# Patient Record
Sex: Female | Born: 1985 | Race: White | Hispanic: No | Marital: Married | Smoking: Never smoker
Health system: Southern US, Community
[De-identification: ages and names within clinical notes are randomized; demographics above are authoritative.]

## PROBLEM LIST (undated history)

## (undated) DIAGNOSIS — Z789 Other specified health status: Secondary | ICD-10-CM

## (undated) HISTORY — PX: WISDOM TOOTH EXTRACTION: SHX21

---

## 2003-10-05 ENCOUNTER — Emergency Department (HOSPITAL_COMMUNITY): Admission: EM | Admit: 2003-10-05 | Discharge: 2003-10-06 | Payer: Self-pay | Admitting: Internal Medicine

## 2005-08-21 ENCOUNTER — Emergency Department (HOSPITAL_COMMUNITY): Admission: EM | Admit: 2005-08-21 | Discharge: 2005-08-21 | Payer: Self-pay | Admitting: Emergency Medicine

## 2007-01-01 IMAGING — CT CT ABDOMEN W/O CM
2 of 4 series · 13 of 32 positions shown, 18 images · IV contrast (agent unspecified)
Comparison: none

CLINICAL DATA: 19 year old with severe back pain on left side.  No prior stones.
 ABDOMEN CT WITHOUT CONTRAST:
TECHNIQUE: Multidetector CT imaging of the abdomen was performed following the standard stone protocol without IV contrast. 
 No prior exams are available for comparison.
TECHNIQUE: Multidetector CT imaging of the pelvis was performed following the standard protocol without IV contrast.

[Series 2: renal stone · axial · 0.66mm/px · z∈[-429,-164]mm · 5 of 81 slices shown, 10 images]
[im 14/81  soft-tissue]
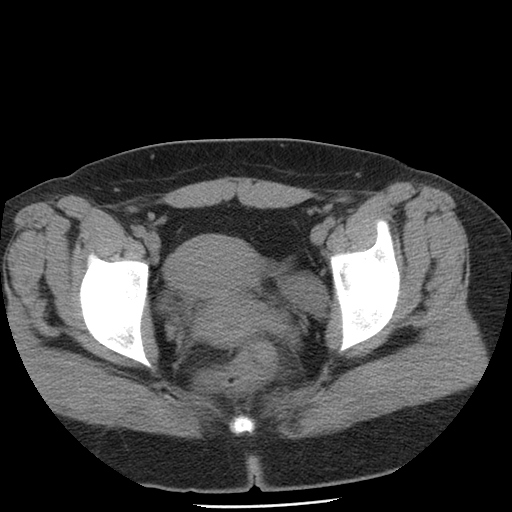
[im 14/81  bone]
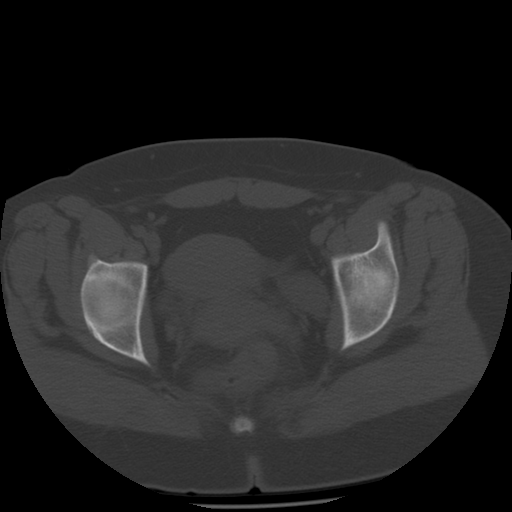
[im 27/81  soft-tissue]
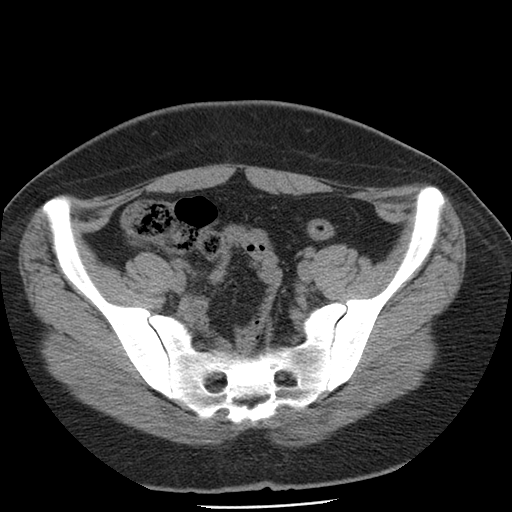
[im 27/81  lung]
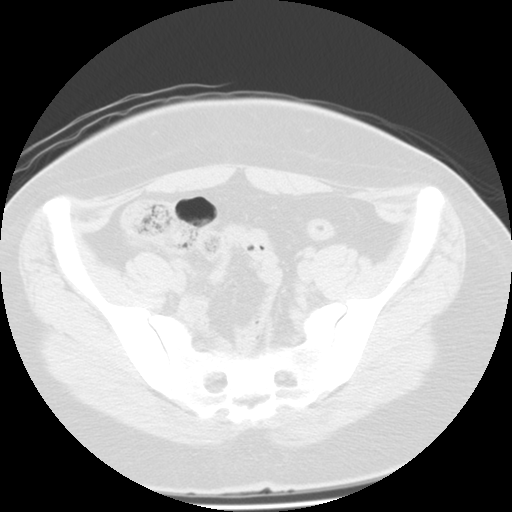
[im 41/81  soft-tissue]
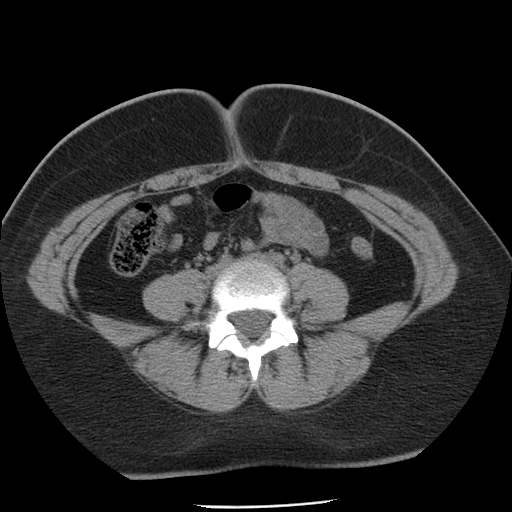
[im 41/81  lung]
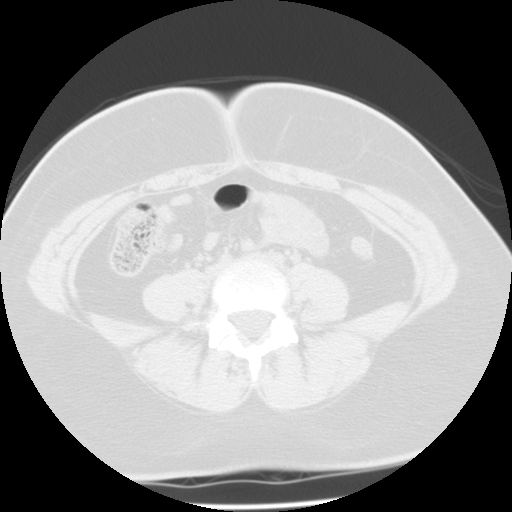
[im 54/81  soft-tissue]
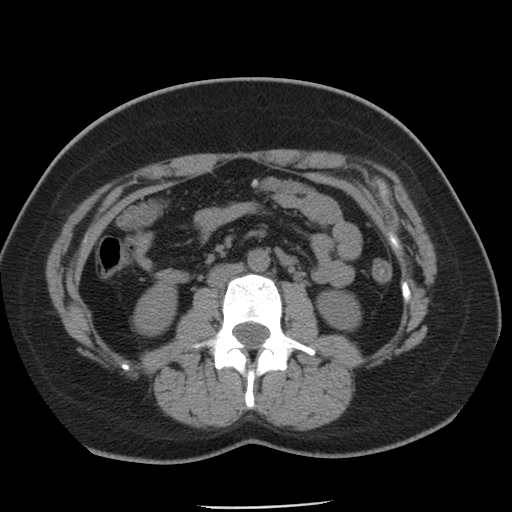
[im 54/81  lung]
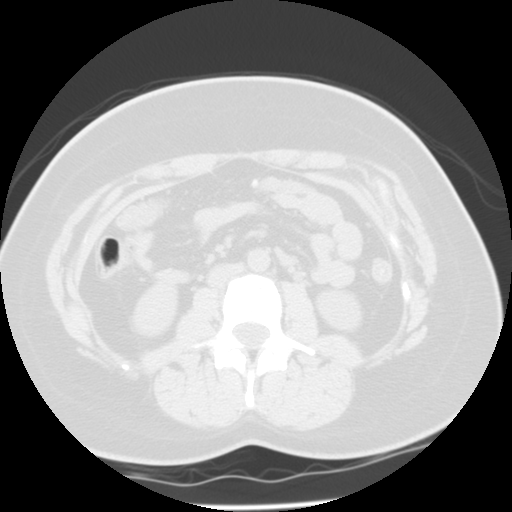
[im 67/81  soft-tissue]
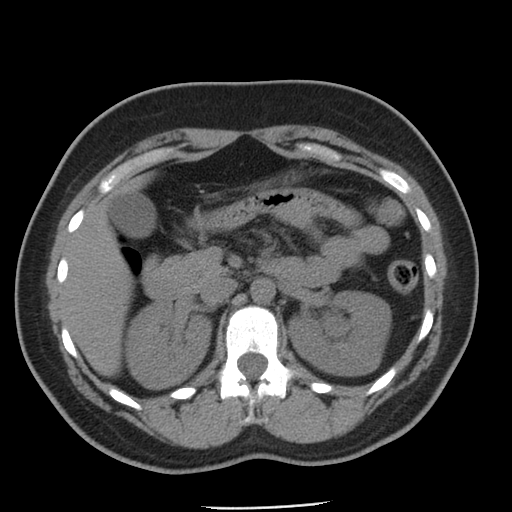
[im 67/81  lung]
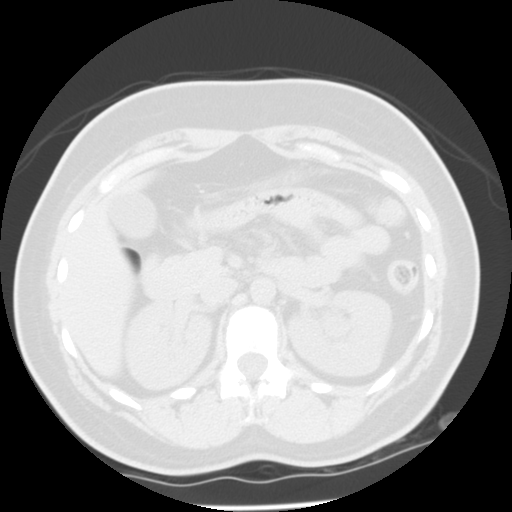

[Series 103: reformatted · sagittal · 0.84mm/px · 8 of 169 slices shown]
[im 13/169  soft-tissue]
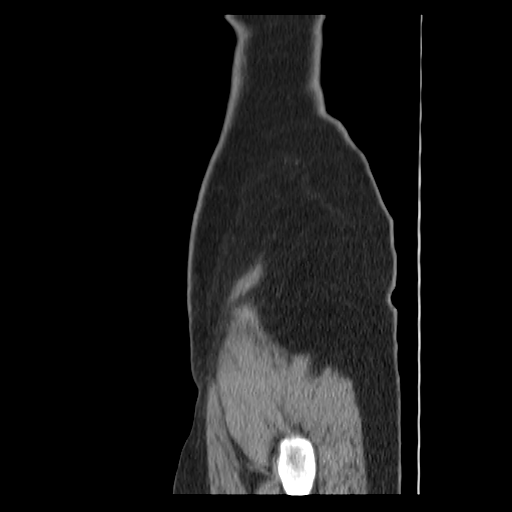
[im 39/169  soft-tissue]
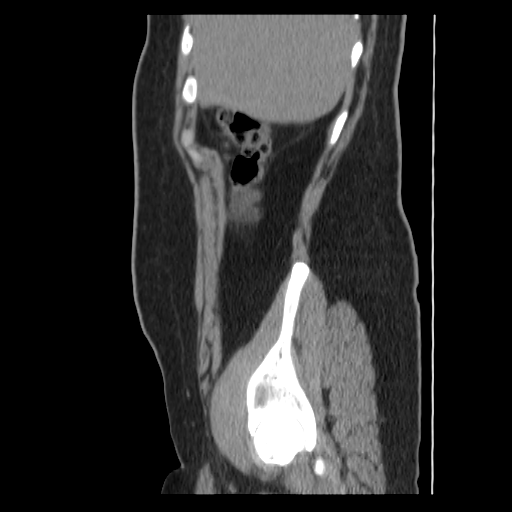
[im 52/169  soft-tissue]
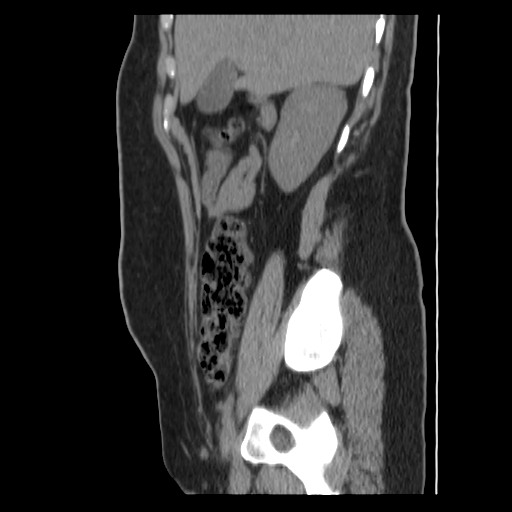
[im 78/169  soft-tissue]
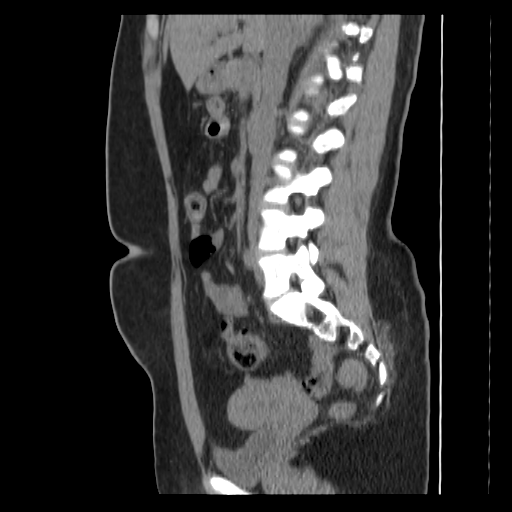
[im 91/169  soft-tissue]
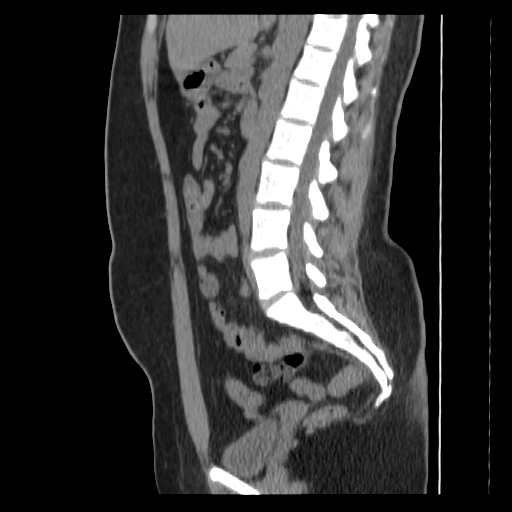
[im 117/169  soft-tissue]
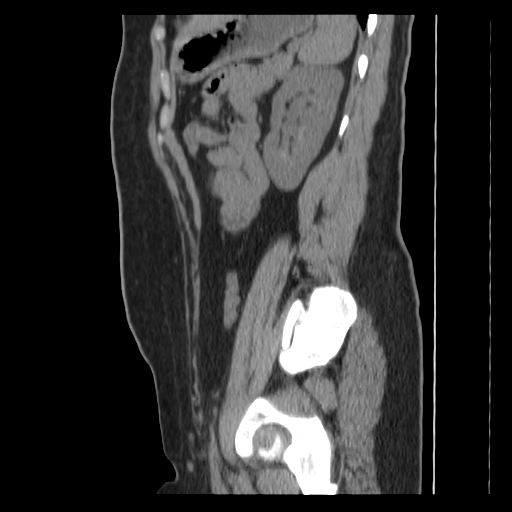
[im 130/169  soft-tissue]
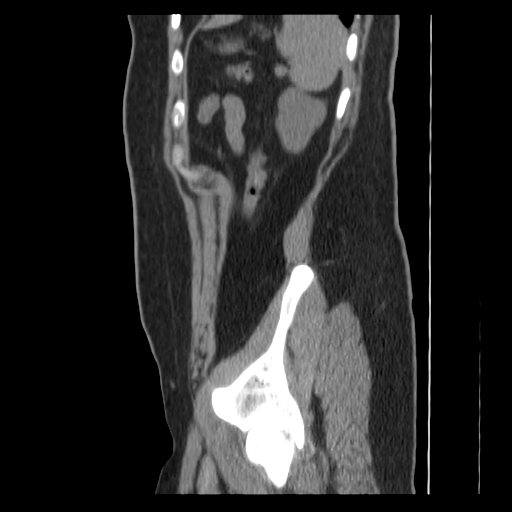
[im 156/169  soft-tissue]
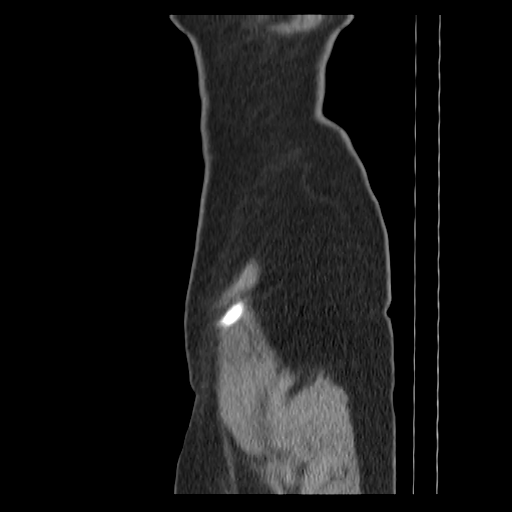

[13 of 32 positions shown; findings below may reference images not displayed]

FINDINGS: Visualized portions of the liver, spleen, pancreas, and right kidney have a normal appearance.  
 There is left hydronephrosis.  An intrarenal calculus is seen in the region of the dilated renal pelvis on the left, measuring 2 mm in diameter.  The left ureter is dilated and somewhat strandy to the level of the bladder.  At the level of the bladder, a 3 mm calculus is seen in the region of the ureterovesical junction.
IMPRESSION: 1.  Left hydronephrosis secondary to an obstructing left ureterovesical junction stone.
 2.  2 mm intrarenal calculus within the dilated left renal pelvis.
 PELVIS CT WITHOUT CONTRAST:
FINDINGS: The uterus is present.  No adnexal mass or free pelvic fluid is identified.
IMPRESSION: Left distal ureteral stone as described above.

## 2012-02-11 ENCOUNTER — Inpatient Hospital Stay (HOSPITAL_COMMUNITY)
Admission: AD | Admit: 2012-02-11 | Discharge: 2012-02-11 | Disposition: A | Discharge status: Home / Self Care | Payer: BC Managed Care – PPO | Source: Ambulatory Visit | Attending: Obstetrics & Gynecology | Admitting: Obstetrics & Gynecology

## 2012-02-11 ENCOUNTER — Emergency Department (HOSPITAL_COMMUNITY)
Admission: EM | Admit: 2012-02-11 | Discharge: 2012-02-11 | Disposition: A | Discharge status: Home / Self Care | Payer: Self-pay | Source: Home / Self Care | Attending: Emergency Medicine | Admitting: Emergency Medicine

## 2012-02-11 ENCOUNTER — Inpatient Hospital Stay (HOSPITAL_COMMUNITY): Payer: BC Managed Care – PPO

## 2012-02-11 ENCOUNTER — Encounter (HOSPITAL_COMMUNITY): Payer: Self-pay

## 2012-02-11 ENCOUNTER — Encounter (HOSPITAL_COMMUNITY): Payer: Self-pay | Admitting: Emergency Medicine

## 2012-02-11 DIAGNOSIS — O039 Complete or unspecified spontaneous abortion without complication: Secondary | ICD-10-CM

## 2012-02-11 HISTORY — DX: Other specified health status: Z78.9

## 2012-02-11 LAB — CBC
HCT: 33.1 % — ABNORMAL LOW (ref 36.0–46.0)
Hemoglobin: 11.2 g/dL — ABNORMAL LOW (ref 12.0–15.0)
MCHC: 33.8 g/dL (ref 30.0–36.0)
MCV: 91.7 fL (ref 78.0–100.0)
WBC: 12.1 10*3/uL — ABNORMAL HIGH (ref 4.0–10.5)

## 2012-02-11 LAB — POCT URINALYSIS DIP (DEVICE)
Ketones, ur: NEGATIVE mg/dL
Protein, ur: 30 mg/dL — AB
Specific Gravity, Urine: >=1.03 (ref 1.005–1.030)

## 2012-02-11 LAB — HCG, QUANTITATIVE, PREGNANCY: hCG, Beta Chain, Quant, S: 539 m[IU]/mL — ABNORMAL HIGH (ref <5)

## 2012-02-11 LAB — POCT PREGNANCY, URINE: Preg Test, Ur: POSITIVE — AB

## 2012-02-11 MED ORDER — HYDROCODONE-ACETAMINOPHEN 5-325 MG PO TABS
ORAL_TABLET | ORAL | Status: AC
  Filled 2012-02-11: qty 2

## 2012-02-11 MED ORDER — HYDROCODONE-ACETAMINOPHEN 5-325 MG PO TABS
2.0000 | ORAL_TABLET | Freq: Once | ORAL | Status: AC
  Administered 2012-02-11: 2 via ORAL

## 2012-02-11 MED ORDER — OXYCODONE-ACETAMINOPHEN 5-325 MG PO TABS: 2.0000 | ORAL_TABLET | ORAL | Status: AC | PRN

## 2012-02-11 MED ORDER — IBUPROFEN 600 MG PO TABS: 600.0000 mg | ORAL_TABLET | Freq: Four times a day (QID) | ORAL | Status: DC | PRN

## 2012-02-11 NOTE — MAU Provider Note (Signed)
Joanna Page [id number] y.o.G1P0 @[redacted]w[redacted]d  by LMP Chief Complaint  Patient presents with  . Miscarriage     None     SUBJECTIVE  HPI: Pt presents to MAU with vaginal bleeding, LLQ pain, and PPT today at urgent care.  She reports period-like cramping with more pain on left side but some tenderness on both sides.  Her pain was intense earlier today but has decreased and is mild now.  Patient's last menstrual period was 12/27/2011.  She has irregular periods.  She and her partner are not actively trying but are not using contraception. Her vaginal bleeding started 1 week ago but increased yesterday and she passed a large gray/black clot which she photographed.  She is not soaking 1 pad/hour or greater but bleeding is bright red and heavy for her. She denies dizziness, n/v, h/a, or fever/chills.    Past Medical History  Diagnosis Date  . No pertinent past medical history    Past Surgical History  Procedure Date  . Wisdom tooth extraction    History   Social History  . Marital Status: Married    Spouse Name: N/A    Number of Children: N/A  . Years of Education: N/A   Occupational History  . Not on file.   Social History Main Topics  . Smoking status: Never Smoker   . Smokeless tobacco: Not on file  . Alcohol Use: Yes     occasional  . Drug Use: No  . Sexually Active: Yes    Birth Control/ Protection: None   Other Topics Concern  . Not on file   Social History Narrative  . No narrative on file   Current Facility-Administered Medications on File Prior to Encounter  Medication Dose Route Frequency Provider Last Rate Last Dose  . HYDROcodone-acetaminophen (NORCO) 5-325 MG per tablet 2 tablet  2 tablet Oral Once Luiz Blare, MD   2 tablet at 02/11/12 1706   Current Outpatient Prescriptions on File Prior to Encounter  Medication Sig Dispense Refill  . ibuprofen (ADVIL,MOTRIN) 800 MG tablet Take 800 mg by mouth every 8 (eight) hours as needed. pain       No Known  Allergies  ROS: Pertinent items in HPI  OBJECTIVE Blood pressure 128/92, pulse 91, temperature 98.2 F (36.8 C), temperature source Oral, resp. rate 16, height 5\' 10"  (1.778 m), weight 104.101 kg (229 lb 8 oz), last menstrual period 12/27/2011, SpO2 100.00%.  GENERAL: Well-developed, well-nourished female in no acute distress.  HEENT: Normocephalic, good dentition HEART: normal rate RESP: normal effort ABDOMEN: Soft, nontender EXTREMITIES: Nontender, no edema NEURO: Alert and oriented Pelvic exam: Cervix pink, visually closed, without lesion, moderate amount bright red bleeding, vaginal walls and external genitalia normal Bimanual exam: Cervix 0/long/high, firm, anterior, neg CMT, uterus mildly tender, nonenlarged, adnexa with mild tenderness on left, none on right, no enlargement or mass bilaterally No guarding with palpation of LLQ  LAB RESULTS Results for orders placed during the hospital encounter of 02/11/12 (from the past 24 hour(s))  CBC     Status: Abnormal   Collection Time   02/11/12  7:55 PM      Component Value Range   WBC 12.1 (*) 4.0 - 10.5 K/uL   RBC 3.61 (*) 3.87 - 5.11 MIL/uL   Hemoglobin 11.2 (*) 12.0 - 15.0 g/dL   HCT 32.4 (*) 40.1 - 02.7 %   MCV 91.7  78.0 - 100.0 fL   MCH 31.0  26.0 - 34.0 pg   MCHC 33.8  30.0 - 36.0 g/dL   RDW 16.1  09.6 - 04.5 %   Platelets 271  150 - 400 K/uL  ABO/RH     Status: Normal   Collection Time   02/11/12  7:55 PM      Component Value Range   ABO/RH(D) A POS    HCG, QUANTITATIVE, PREGNANCY     Status: Abnormal   Collection Time   02/11/12  7:55 PM      Component Value Range   hCG, Beta Chain, Quant, S 539 (*) <5 mIU/mL     IMAGING US Ob Comp Less 14 Wks  02/11/2012  *RADIOLOGY REPORT*  Clinical Data: 6-week 4 days pregnant by last menstrual period. Bleeding and passing clots.  Beta HCG pending.  OBSTETRIC <14 WK [place] AND TRANSVAGINAL OB US  Technique:  Both transabdominal and transvaginal ultrasound examinations were  performed for complete evaluation of the gestation as well as the maternal uterus, adnexal regions, and pelvic cul-de-sac.  Transvaginal technique was performed to assess early pregnancy.  Comparison:  CT of 08/21/2005.  Intrauterine gestational sac:  Absent Yolk sac: Absent Embryo: Absent; endometrial thickening at up to 2.0 cm.  Maternal uterus/adnexae: The right ovary / adnexa is within normal limits.  Along the inferior aspect of the left ovary is a 3.5 x 2.4 x 3.1 cm heterogeneous "mass."  There is suggestion of minimal vascularity on image 46.  There is a moderate amount of complex pelvic fluid.  IMPRESSION:  1.  Lack of visualization of the gestational sac or intrauterine pregnancy.  Differential considerations include early intrauterine pregnancy, missed abortion, and ectopic pregnancy.  Correlation with ongoing beta HCG and possibly pelvic ultrasound follow-up should be considered. 2.  Nonspecific left adnexal "mass."  Concurrent complex moderate volume pelvic fluid.  Differential considerations include rupture of a hemorrhagic corpus luteal cyst or otherwise occult ectopic pregnancy.  Recommend clinical correlation, as the clinical history does not describe pain or other symptoms to suggest a ruptured ectopic pregnancy.  Original Report Authenticated By: Consuello Bossier, M.D.   US Ob Transvaginal  02/11/2012  *RADIOLOGY REPORT*  Clinical Data: 6-week 4 days pregnant by last menstrual period. Bleeding and passing clots.  Beta HCG pending.  OBSTETRIC <14 WK [place] AND TRANSVAGINAL OB US  Technique:  Both transabdominal and transvaginal ultrasound examinations were performed for complete evaluation of the gestation as well as the maternal uterus, adnexal regions, and pelvic cul-de-sac.  Transvaginal technique was performed to assess early pregnancy.  Comparison:  CT of 08/21/2005.  Intrauterine gestational sac:  Absent Yolk sac: Absent Embryo: Absent; endometrial thickening at up to 2.0 cm.  Maternal  uterus/adnexae: The right ovary / adnexa is within normal limits.  Along the inferior aspect of the left ovary is a 3.5 x 2.4 x 3.1 cm heterogeneous "mass."  There is suggestion of minimal vascularity on image 46.  There is a moderate amount of complex pelvic fluid.  IMPRESSION:  1.  Lack of visualization of the gestational sac or intrauterine pregnancy.  Differential considerations include early intrauterine pregnancy, missed abortion, and ectopic pregnancy.  Correlation with ongoing beta HCG and possibly pelvic ultrasound follow-up should be considered. 2.  Nonspecific left adnexal "mass."  Concurrent complex moderate volume pelvic fluid.  Differential considerations include rupture of a hemorrhagic corpus luteal cyst or otherwise occult ectopic pregnancy.  Recommend clinical correlation, as the clinical history does not describe pain or other symptoms to suggest a ruptured ectopic pregnancy.  Original Report Authenticated By: Karn Cassis.  TALBOT, M.D.    ASSESSMENT SAB  PLAN Called Dr Debroah Loop to discuss assessment, findings, and U/S report D/C home with ectopic teaching/precautions, and bleeding precautions Discussed likelihood of SAB with pt and s/o, opportunity for questions provided Return to MAU in 48 hours for f/u quantitative HCG   LEFTWICH-KIRBY, Medha Pippen 02/11/2012 10:00 PM

## 2012-02-11 NOTE — Discharge Instructions (Signed)
Go to [facility] now. You need ultrasound, blood work done.

## 2012-02-11 NOTE — ED Provider Notes (Signed)
History     CSN: [id number]  Arrival date & time 02/11/12  1355   First MD Initiated Contact with Patient 02/11/12 1451      Chief Complaint  Patient presents with  . Dysmenorrhea    (Consider location/radiation/quality/duration/timing/severity/associated sxs/prior treatment) HPI Comments: Patient is a G1 P0 who reports severe, intermittent, crampy lower midline abdominal pain starting yesterday. States she passed some tissue, and is having heavy bleeding. States she has going through 4 tampons today. She took ibuprofen 800 mg around 1 PM today without relief. No aggravating or alleviating factors. She is unsure when her last normal menstrual period was- "some time around the end of April or first of March". States she has irregular menses, but that her period was about a week late this time. This is not unusual for her. No chest pain no shortness of breath, presyncope, syncope. Patient has no history of PID, abdominal surgeries.    ROS as noted in HPI. All other ROS negative.   Patient is a 26 y.o. female presenting with vaginal bleeding. The history is provided by the patient.  Vaginal Bleeding This is a new problem. The current episode started yesterday. The problem occurs constantly. The problem has been gradually worsening. Associated symptoms include abdominal pain. Nothing aggravates the symptoms. Nothing relieves the symptoms. Treatments tried: ibu 800. The treatment provided no relief.    History reviewed. No pertinent past medical history.  History reviewed. No pertinent past surgical history.  History reviewed. No pertinent family history.  History  Substance Use Topics  . Smoking status: Never Smoker   . Smokeless tobacco: Not on file  . Alcohol Use: Yes    OB History    Grav Para Term Preterm Abortions TAB SAB Ect Mult Living                  Review of Systems  Gastrointestinal: Positive for abdominal pain.  Genitourinary: Positive for vaginal bleeding.     Allergies  Review of patient's allergies indicates no known allergies.  Home Medications   Current Outpatient Rx  Name Route Sig Dispense Refill  . IBUPROFEN 800 MG PO TABS Oral Take 800 mg by mouth every 8 (eight) hours as needed.      BP 133/81  Pulse 98  Temp 98.8 F (37.1 C) (Oral)  Resp 18  SpO2 99%  LMP 02/11/2012  Physical Exam  Nursing note and vitals reviewed. Constitutional: She is oriented to person, place, and time. She appears well-developed and well-nourished. She appears distressed.  HENT:  Head: Normocephalic and atraumatic.  Eyes: Conjunctivae and EOM are normal.  Neck: Normal range of motion.  Cardiovascular: Normal rate.   Pulmonary/Chest: Effort normal.  Abdominal: She exhibits no distension.  Musculoskeletal: Normal range of motion.  Neurological: She is alert and oriented to person, place, and time.  Skin: Skin is warm and dry.  Psychiatric: She has a normal mood and affect. Her behavior is normal. Judgment and thought content normal.    ED Course  Procedures (including critical care time)  Labs Reviewed  POCT URINALYSIS DIP (DEVICE) - Abnormal; Notable for the following:    Hgb urine dipstick LARGE (*)     Protein, ur 30 (*)     All other components within normal limits  POCT PREGNANCY, URINE - Abnormal; Notable for the following:    Preg Test, Ur POSITIVE (*)     All other components within normal limits   No results found.   1. Miscarriage  Results for orders placed during the hospital encounter of 02/11/12  POCT URINALYSIS DIP (DEVICE)      Component Value Range   Glucose, UA NEGATIVE  NEGATIVE mg/dL   Bilirubin Urine NEGATIVE  NEGATIVE   Ketones, ur NEGATIVE  NEGATIVE mg/dL   Specific Gravity, Urine >=1.030  1.005 - 1.030   Hgb urine dipstick LARGE (*) NEGATIVE   pH 6.0  5.0 - 8.0   Protein, ur 30 (*) NEGATIVE mg/dL   Urobilinogen, UA 0.2  0.0 - 1.0 mg/dL   Nitrite NEGATIVE  NEGATIVE   Leukocytes, UA NEGATIVE   NEGATIVE  POCT PREGNANCY, URINE      Component Value Range   Preg Test, Ur POSITIVE (*) NEGATIVE    MDM  Patient approximately 6-[redacted] weeks pregnant in process of miscarrying. Patient has a photo of the tissue that she passed, which is consistent with products of conception. Urine pregnancy positive.  Gave Norco 2 tabs by mouth here. Discussed with MAU.  Deferring pelvic here, Transferring to women's for bloodwork, u/s to r/o retained POC +/- rhogam.  Luiz Blare, MD 02/11/12 1701

## 2012-02-11 NOTE — Discharge Instructions (Signed)
Miscarriage (Spontaneous Miscarriage) A miscarriage is when you lose your baby before the twentieth week of pregnancy. Miscarriages happen in 15-20% of pregnancies. Most miscarriages happen in the first 13 weeks of the pregnancy. In medical terms, this is called a spontaneous miscarriage or early pregnancy loss. No further treatment is needed when the miscarriage is complete and all products of conception have been passed out of the body. You can begin trying for another pregnancy as soon as your caregiver says it is okay. CAUSES   Most causes are not known.   Genetic problems like abnormal, not enough or too many chromosomes.   Infection of the cervix or uterus.   An abnormal shaped uterus, fibroid tumors or congenital abnormalities.   Hormone problems.   Medical problems.   Incompetent cervix, the tissue in the cervix is not strong enough to hold the pregnancy.   Smoking, too much alcohol use and illegal drugs.   Trauma.  SYMPTOMS   Bleeding or spotting from the vagina.   Cramping of the lower abdomen.   Passing of fluid from the vagina with or without cramps or pain.   Passing fetal tissue.  TREATMENT   Sometimes no further treatment is necessary if you pass all the tissue in the uterus.   If partial parts of the fetus or placenta remain in the body (incomplete miscarriage), tissue left behind may become infected. Usually a D and C (Dilatation and Curettage) suction or scrapping of the uterus is necessary to remove the remaining tissue in uterus. The procedure is only done when your caregiver knows that there is no chance for the pregnancy to continue. This is determined by a physical exam, a negative pregnancy test, blood tests and perhaps an ultrasound revealing a dead fetus or no fetus developing because a problem occurred at conception (when the sperm and egg unite).   Medications may be necessary, antibiotics if there is an infection or medications to contract the uterus  if there is a lot of bleeding.   If you have Rh negative blood and your partner is Rh positive, you will need a Rho-gam shot (an immune globulin vaccine). This will protect your baby from having Rh blood problems in future pregnancies.  HOME CARE INSTRUCTIONS   Your caregiver may order bed rest (up to the bathroom only). He or she may allow you to continue light activity. You may need to make arrangements for the care of children and for any other responsibilities.   Keep track of the number of pads you use each day and how soaked (saturated) they are. Record this information.   Do not use tampons. Do not douche or have sexual intercourse until approved by your caregiver.   Only take over-the-counter or prescription medicines for pain, discomfort or fever as directed by your caregiver.   Do not take aspirin because it can cause bleeding.   It is very important to keep all follow-up appointments for re-evaluations and continuing management.   Tell your caregiver if you are experiencing domestic violence.   Women who have an Rh negative blood type (i.e., A, B, AB, or O negative) need to receive a drug called Rh(D) immune globulin (RhoGam). This medicine helps protect future fetuses against problems that can occur if an Rh negative mother is carrying a baby who is Rh positive.   If you and/or your partner are having problems with guilt or grieving, talk to your caregiver or seek counseling to help you cope with the pregnancy loss.   Allow enough time to grieve before trying to get pregnant again.  SEEK IMMEDIATE MEDICAL CARE IF:   You have severe cramps or pain in your stomach, back, or belly (abdomen).   You have a fever.   You pass large clots or tissue. Save any tissue for your caregiver to inspect.   Your bleeding increases.   You become light-headed, weak or have fainting episodes.   You develop chills.  Document Released: 02/10/2001 Document Revised: 08/06/2011 Document Reviewed:  03/19/2008 [employer] Patient Information 2012 [employer] .Ectopic Pregnancy An ectopic pregnancy is when the fertilized egg attaches (implants) outside the uterus. Most ectopic pregnancies occur in the fallopian tube. Rarely do ectopic pregnancies occur on the ovary, intestine, pelvis, or cervix. An ectopic pregnancy does not have the ability to develop into a normal, healthy baby.  A ruptured ectopic pregnancy is one in which the fallopian tube gets torn or bursts and results in internal bleeding. Often there is intense abdominal pain, and sometimes, vaginal bleeding. Having an ectopic pregnancy can be a life-threatening experience. If left untreated, this dangerous condition can lead to a blood transfusion, abdominal surgery, or even death. CAUSES  Damage to the fallopian tubes is the suspected cause in most ectopic pregnancies.  RISK FACTORS Depending on your circumstances, the amount of risk of having an ectopic pregnancy will vary. There are 3 categories that may help you identify whether you are potentially at risk. High Risk  You have gone through infertility treatment.   You have had a previous ectopic pregnancy.   You have had previous tubal surgery.   You have had previous surgery to have the fallopian tubes tied (tubal ligation).   You have tubal problems or diseases.   You have been exposed to DES. DES is a medicine that was used until 1971 and had effects on babies whose mothers took the medicine.   You become pregnant while using an intrauterine device (IUD) for birth control.  Moderate Risk  You have a history of infertility.   You have a history of a sexually transmitted infection (STI).   You have a history of pelvic inflammatory disease (PID).   You have scarring from endometriosis.   You have multiple sexual partners.   You smoke.  Low Risk  You have had previous pelvic surgery.   You use vaginal douching.   You became sexually active before 28  years of age.  SYMPTOMS  An ectopic pregnancy should be suspected in anyone who has missed a period and has abdominal pain or bleeding.  You may experience normal pregnancy symptoms, such as:   Nausea.   Tiredness.   Breast tenderness.   Symptoms that are not normal include:   Pain with intercourse.   Irregular vaginal bleeding or spotting.   Cramping or pain on one side, or in the lower abdomen.   Fast heartbeat.   Passing out while having a bowel movement.   Symptoms of a ruptured ectopic pregnancy and internal bleeding may include:   Sudden, severe pain in the abdomen and pelvis.   Dizziness or fainting.   Pain in the shoulder area.  DIAGNOSIS  Tests that may be performed include:  A pregnancy test.   An ultrasound.   Testing the specific level of pregnancy hormone in the bloodstream.   Taking a sample of uterus tissue (dilation and curettage, D&C).   Surgery to perform a visual exam of the inside of the abdomen using a lighted tube (laparoscopy).  TREATMENT    An injection of methotrexate medicine may be given. This is given if:  The diagnosis is made early.   The fallopian tube has not ruptured.   You are considered to be a good candidate for the medicine.  Usually, pregnancy hormone blood levels are checked after methotrexate treatment. This is to be sure the medicine is effective. It may take 4 to 6 weeks for the pregnancy to be absorbed (though most pregnancies will be absorbed by 3 weeks). Surgical treatment may be needed. A lighted tube (laparoscope) is used to remove the tubal pregnancy. If severe internal bleeding occurs, a cut (incision) may be made in the lower abdomen (laparotomy), and the ectopic pregnancy is removed. This stops the bleeding. Part of the fallopian tube, or the whole tube, may be removed as well (salpingectomy). After surgery, pregnancy hormone tests may be done to be sure there is no pregnancy tissue left. You may receive a RhoGAM  shot if you are Rh negative and the father is Rh positive, or if you do not know the Rh type of the father. This is to prevent problems with any future pregnancy. SEEK IMMEDIATE MEDICAL CARE IF:  You have any symptoms of an ectopic pregnancy. This is a medical emergency. Document Released: 09/24/2004 Document Revised: 08/06/2011 Document Reviewed: 04/23/2011 [employer] Patient Information 2012 [employer]. 

## 2012-02-11 NOTE — MAU Note (Signed)
Pt states passed clots and tissue yesterday, lmp-12/27/2011, having lower abd cramps that began back today, moderate bleeding at present, changing tampon every few hours. Used 5 tampons thus far. Earlier noted blood clots, none at present.

## 2012-02-11 NOTE — ED Notes (Signed)
Patient reports abdominal pain, unusual for menses.  Also has described an unusually heavy period, occuring late in cycle.  Not on birth control.  Dr Chaney Malling with patient

## 2012-02-13 ENCOUNTER — Inpatient Hospital Stay (HOSPITAL_COMMUNITY)
Admission: AD | Admit: 2012-02-13 | Discharge: 2012-02-13 | Disposition: A | Discharge status: Home / Self Care | Payer: BC Managed Care – PPO | Source: Ambulatory Visit | Attending: Obstetrics & Gynecology | Admitting: Obstetrics & Gynecology

## 2012-02-13 ENCOUNTER — Other Ambulatory Visit: Payer: Self-pay | Admitting: Family

## 2012-02-13 ENCOUNTER — Encounter (HOSPITAL_COMMUNITY): Payer: Self-pay | Admitting: *Deleted

## 2012-02-13 ENCOUNTER — Encounter (HOSPITAL_COMMUNITY): Payer: Self-pay | Admitting: Emergency Medicine

## 2012-02-13 DIAGNOSIS — O009 Unspecified ectopic pregnancy without intrauterine pregnancy: Secondary | ICD-10-CM

## 2012-02-13 DIAGNOSIS — O00109 Unspecified tubal pregnancy without intrauterine pregnancy: Secondary | ICD-10-CM | POA: Insufficient documentation

## 2012-02-13 LAB — CBC
Hemoglobin: 10.9 g/dL — ABNORMAL LOW (ref 12.0–15.0)
RBC: 3.5 MIL/uL — ABNORMAL LOW (ref 3.87–5.11)

## 2012-02-13 LAB — DIFFERENTIAL
Lymphs Abs: 2.8 10*3/uL (ref 0.7–4.0)
Monocytes Relative: 9 % (ref 3–12)
Neutro Abs: 7.5 10*3/uL (ref 1.7–7.7)
Neutrophils Relative %: 66 % (ref 43–77)

## 2012-02-13 LAB — CREATININE, SERUM
Creatinine, Ser: 0.56 mg/dL (ref 0.50–1.10)
GFR calc Af Amer: >90 mL/min (ref >90)
GFR calc non Af Amer: >90 mL/min (ref >90)

## 2012-02-13 LAB — HCG, QUANTITATIVE, PREGNANCY: hCG, Beta Chain, Quant, S: 552 m[IU]/mL — ABNORMAL HIGH (ref <5)

## 2012-02-13 LAB — BUN: BUN: 12 mg/dL (ref 6–23)

## 2012-02-13 MED ORDER — METHOTREXATE INJECTION FOR WOMEN'S HOSPITAL
50.0000 mg/m2 | Freq: Once | INTRAMUSCULAR | Status: AC
  Administered 2012-02-13: 115 mg via INTRAMUSCULAR
  Filled 2012-02-13: qty 2.3

## 2012-02-13 NOTE — MAU Provider Note (Signed)
History   Chief Complaint:  Follow-up   Joanna Page is  26 y.o. G1P0 Patient's last menstrual period was 12/27/2011.Marland Kitchen Patient is here for follow up of quantitative HCG and ongoing surveillance of pregnancy status.   She is [redacted]w[redacted]d weeks gestation  by LMP.    Since her last visit, the patient is without new complaint.   The patient reports bleeding as  none now.    General ROS:  negative  Her previous Quantitative HCG values are: 6/13: 539    Physical Exam   Blood pressure 131/72, pulse 84, temperature 99.2 F (37.3 C), temperature source Oral, resp. rate 16, height 5\' 10"  (1.778 m), weight 229 lb 6 oz (104.044 kg), last menstrual period 12/27/2011.  Focused Gynecological Exam: examination not indicated  Labs: Recent Results (from the past 24 hour(s))  HCG, QUANTITATIVE, PREGNANCY   Collection Time   02/13/12  8:25 PM      Component Value Range   hCG, Beta Chain, Quant, S 552 (*) <5 mIU/mL    Ultrasound Studies:   RADIOLOGY REPORT*  Clinical Data: 6-week 4 days pregnant by last menstrual period.  Bleeding and passing clots. Beta HCG pending.  OBSTETRIC <14 WK [place] AND TRANSVAGINAL OB US  Technique: Both transabdominal and transvaginal ultrasound  examinations were performed for complete evaluation of the  gestation as well as the maternal uterus, adnexal regions, and  pelvic cul-de-sac. Transvaginal technique was performed to assess  early pregnancy.  Comparison: CT of 08/21/2005.  Intrauterine gestational sac: Absent  Yolk sac: Absent  Embryo: Absent; endometrial thickening at up to 2.0 cm.  Maternal uterus/adnexae:  The right ovary / adnexa is within normal limits. Along the  inferior aspect of the left ovary is a 3.5 x 2.4 x 3.1 cm  heterogeneous "mass." There is suggestion of minimal vascularity  on image 46.  There is a moderate amount of complex pelvic fluid.  IMPRESSION:  1. Lack of visualization of the gestational sac or intrauterine  pregnancy. Differential  considerations include early intrauterine  pregnancy, missed abortion, and ectopic pregnancy. Correlation  with ongoing beta HCG and possibly pelvic ultrasound follow-up  should be considered.  2. Nonspecific left adnexal "mass." Concurrent complex moderate  volume pelvic fluid. Differential considerations include rupture  of a hemorrhagic corpus luteal cyst or otherwise occult ectopic  pregnancy. Recommend clinical correlation, as the clinical history  does not describe pain or other symptoms to suggest a ruptured  ectopic pregnancy.  Original Report Authenticated By: Consuello Bossier, M.D.   ED Course: Results discussed with patient. Reviewed highly suspicious of ectopic given abnormal rise in [place] and [place]. Recommend MTX tonight. Risk/benefits reviewed. Pt verbalizes understanding and agrees with plan.   MD Consult: Discussed with Dr. Macon Large, agrees with plan for MTX tonight  Assessment: Ectopic Pregnancy   Plan: MTX now, FU in 4 days per protocol Precautions reviewed  Joanna Page E. 02/13/2012, 9:42 PM

## 2012-02-13 NOTE — Discharge Instructions (Signed)
Ectopic Pregnancy An ectopic pregnancy is when the fertilized egg attaches (implants) outside the uterus. Most ectopic pregnancies occur in the fallopian tube. Rarely do ectopic pregnancies occur on the ovary, intestine, pelvis, or cervix. An ectopic pregnancy does not have the ability to develop into a normal, healthy baby.  A ruptured ectopic pregnancy is one in which the fallopian tube gets torn or bursts and results in internal bleeding. Often there is intense abdominal pain, and sometimes, vaginal bleeding. Having an ectopic pregnancy can be a life-threatening experience. If left untreated, this dangerous condition can lead to a blood transfusion, abdominal surgery, or even death. CAUSES  Damage to the fallopian tubes is the suspected cause in most ectopic pregnancies.  RISK FACTORS Depending on your circumstances, the amount of risk of having an ectopic pregnancy will vary. There are 3 categories that may help you identify whether you are potentially at risk. High Risk  You have gone through infertility treatment.   You have had a previous ectopic pregnancy.   You have had previous tubal surgery.   You have had previous surgery to have the fallopian tubes tied (tubal ligation).   You have tubal problems or diseases.   You have been exposed to DES. DES is a medicine that was used until 1971 and had effects on babies whose mothers took the medicine.   You become pregnant while using an intrauterine device (IUD) for birth control.  Moderate Risk  You have a history of infertility.   You have a history of a sexually transmitted infection (STI).   You have a history of pelvic inflammatory disease (PID).   You have scarring from endometriosis.   You have multiple sexual partners.   You smoke.  Low Risk  You have had previous pelvic surgery.   You use vaginal douching.   You became sexually active before 28 years of age.  SYMPTOMS  An ectopic pregnancy should be suspected  in anyone who has missed a period and has abdominal pain or bleeding.  You may experience normal pregnancy symptoms, such as:   Nausea.   Tiredness.   Breast tenderness.   Symptoms that are not normal include:   Pain with intercourse.   Irregular vaginal bleeding or spotting.   Cramping or pain on one side, or in the lower abdomen.   Fast heartbeat.   Passing out while having a bowel movement.   Symptoms of a ruptured ectopic pregnancy and internal bleeding may include:   Sudden, severe pain in the abdomen and pelvis.   Dizziness or fainting.   Pain in the shoulder area.  DIAGNOSIS  Tests that may be performed include:  A pregnancy test.   An ultrasound.   Testing the specific level of pregnancy hormone in the bloodstream.   Taking a sample of uterus tissue (dilation and curettage, D&C).   Surgery to perform a visual exam of the inside of the abdomen using a lighted tube (laparoscopy).  TREATMENT  An injection of methotrexate medicine may be given. This is given if:  The diagnosis is made early.   The fallopian tube has not ruptured.   You are considered to be a good candidate for the medicine.  Usually, pregnancy hormone blood levels are checked after methotrexate treatment. This is to be sure the medicine is effective. It may take 4 to 6 weeks for the pregnancy to be absorbed (though most pregnancies will be absorbed by 3 weeks). Surgical treatment may be needed. A lighted tube (laparoscope) is   used to remove the tubal pregnancy. If severe internal bleeding occurs, a cut (incision) may be made in the lower abdomen (laparotomy), and the ectopic pregnancy is removed. This stops the bleeding. Part of the fallopian tube, or the whole tube, may be removed as well (salpingectomy). After surgery, pregnancy hormone tests may be done to be sure there is no pregnancy tissue left. You may receive a RhoGAM shot if you are Rh negative and the father is Rh positive, or if you do  not know the Rh type of the father. This is to prevent problems with any future pregnancy. SEEK IMMEDIATE MEDICAL CARE IF:  You have any symptoms of an ectopic pregnancy. This is a medical emergency. Document Released: 09/24/2004 Document Revised: 08/06/2011 Document Reviewed: 04/23/2011 [facility] Patient Information 2012 [place], [place].Methotrexate Treatment for an Ectopic Pregnancy Care After Refer to this sheet in the next few weeks. These instructions provide you with information on caring for yourself after your treatment. Your caregiver may also give you more specific instructions. Your treatment has been planned according to current medical practices, but problems sometimes occur. Call your caregiver if you have any problems or questions after your treatment. HOME CARE INSTRUCTIONS  After you have received the methotrexate medicine, you need to be careful of your activities and watch your condition for several weeks. Even with methotrexate, an ectopic pregnancy can begin to bleed without warning. It may take 1 week before your pregnancy hormone level starts to drop.  Keep all follow-up blood work appointments as directed by your caregiver. This is very important. Blood work is usually done in 4 to 7 days, and then at weekly visits until there is no pregnancy hormone detected.   Avoid traveling too far away from your caregiver.   Do not have sexual intercourse until your caregiver says it is safe to do so.   You may resume your usual diet.   Limit strenuous activity.   Do not take folic acid, prenatal vitamins, or other vitamins that contain folic acid.   Do not take aspirin, ibuprofen, or naproxen (nonsteroidal anti-inflammatory drugs, NSAIDs).   Do not drink alcohol.  SEEK MEDICAL CARE IF:   You cannot control your nausea and vomiting.   You cannot control your diarrhea.   You have sores in your mouth and want treatment.   You need pain medicine for your abdominal pain.    You have a rash.   You are having a reaction to the medicine.  SEEK IMMEDIATE MEDICAL CARE IF:   You experience increasing abdominal or pelvic pain.   You notice increased bleeding.   You feel lightheaded or you faint.   You have shortness of breath.   Your heart rate increases.   You have a cough.   You have chills.   You have a fever.  MAKE SURE YOU:  Understand these instructions.   Will watch your condition.   Will get help right away if you are not doing well or get worse.  Document Released: 08/06/2011 Document Reviewed: 08/04/2011 [facility] Patient Information 2012 [place], [place].

## 2012-02-13 NOTE — MAU Note (Signed)
Pt states, " I am here for lab work. They think I had a miscarriage two days ago. I am still bleeding some but not as much and I am still cramping."

## 2012-02-13 NOTE — Progress Notes (Signed)
Written and verbal d/c instructions given and understanding voiced. Pt to lobby to wait after inj before leaving.

## 2012-02-13 NOTE — MAU Note (Signed)
Joanna Page CNM talked with pt in Triage Rm regarding lab results and need for MTX due to ectopic. PT voices understanding and will proceed with MTX and necessary f/u.

## 2012-02-13 NOTE — MAU Provider Note (Signed)
Attestation of Attending Supervision of Advanced Practitioner (CNM/NP): Evaluation and management procedures were performed by the Advanced Practitioner under my supervision and collaboration.  I have reviewed the Advanced Practitioner's note and chart, and I agree with the management and plan.  Jaynie Collins, M.D. 02/13/2012 10:29 PM

## 2012-02-16 ENCOUNTER — Inpatient Hospital Stay (HOSPITAL_COMMUNITY)
Admission: AD | Admit: 2012-02-16 | Discharge: 2012-02-16 | Disposition: A | Discharge status: Home / Self Care | Payer: BC Managed Care – PPO | Source: Ambulatory Visit | Attending: Obstetrics & Gynecology | Admitting: Obstetrics & Gynecology

## 2012-02-16 ENCOUNTER — Ambulatory Visit (HOSPITAL_COMMUNITY): Payer: BC Managed Care – PPO

## 2012-02-16 DIAGNOSIS — O009 Unspecified ectopic pregnancy without intrauterine pregnancy: Secondary | ICD-10-CM

## 2012-02-16 DIAGNOSIS — O00119 Unspecified tubal pregnancy with intrauterine pregnancy: Secondary | ICD-10-CM

## 2012-02-16 DIAGNOSIS — O00109 Unspecified tubal pregnancy without intrauterine pregnancy: Secondary | ICD-10-CM | POA: Insufficient documentation

## 2012-02-16 LAB — HCG, QUANTITATIVE, PREGNANCY: hCG, Beta Chain, Quant, S: 581 m[IU]/mL — ABNORMAL HIGH (ref <5)

## 2012-02-16 NOTE — Discharge Instructions (Signed)
Ectopic Pregnancy An ectopic pregnancy is when the fertilized egg attaches (implants) outside the uterus. Most ectopic pregnancies occur in the fallopian tube. Rarely do ectopic pregnancies occur on the ovary, intestine, pelvis, or cervix. An ectopic pregnancy does not have the ability to develop into a normal, healthy baby.  A ruptured ectopic pregnancy is one in which the fallopian tube gets torn or bursts and results in internal bleeding. Often there is intense abdominal pain, and sometimes, vaginal bleeding. Having an ectopic pregnancy can be a life-threatening experience. If left untreated, this dangerous condition can lead to a blood transfusion, abdominal surgery, or even death. CAUSES  Damage to the fallopian tubes is the suspected cause in most ectopic pregnancies.  RISK FACTORS Depending on your circumstances, the amount of risk of having an ectopic pregnancy will vary. There are 3 categories that may help you identify whether you are potentially at risk. High Risk  You have gone through infertility treatment.   You have had a previous ectopic pregnancy.   You have had previous tubal surgery.   You have had previous surgery to have the fallopian tubes tied (tubal ligation).   You have tubal problems or diseases.   You have been exposed to DES. DES is a medicine that was used until 1971 and had effects on babies whose mothers took the medicine.   You become pregnant while using an intrauterine device (IUD) for birth control.  Moderate Risk  You have a history of infertility.   You have a history of a sexually transmitted infection (STI).   You have a history of pelvic inflammatory disease (PID).   You have scarring from endometriosis.   You have multiple sexual partners.   You smoke.  Low Risk  You have had previous pelvic surgery.   You use vaginal douching.   You became sexually active before 28 years of age.  SYMPTOMS  An ectopic pregnancy should be suspected  in anyone who has missed a period and has abdominal pain or bleeding.  You may experience normal pregnancy symptoms, such as:   Nausea.   Tiredness.   Breast tenderness.   Symptoms that are not normal include:   Pain with intercourse.   Irregular vaginal bleeding or spotting.   Cramping or pain on one side, or in the lower abdomen.   Fast heartbeat.   Passing out while having a bowel movement.   Symptoms of a ruptured ectopic pregnancy and internal bleeding may include:   Sudden, severe pain in the abdomen and pelvis.   Dizziness or fainting.   Pain in the shoulder area.  DIAGNOSIS  Tests that may be performed include:  A pregnancy test.   An ultrasound.   Testing the specific level of pregnancy hormone in the bloodstream.   Taking a sample of uterus tissue (dilation and curettage, D&C).   Surgery to perform a visual exam of the inside of the abdomen using a lighted tube (laparoscopy).  TREATMENT  An injection of methotrexate medicine may be given. This is given if:  The diagnosis is made early.   The fallopian tube has not ruptured.   You are considered to be a good candidate for the medicine.  Usually, pregnancy hormone blood levels are checked after methotrexate treatment. This is to be sure the medicine is effective. It may take 4 to 6 weeks for the pregnancy to be absorbed (though most pregnancies will be absorbed by 3 weeks). Surgical treatment may be needed. A lighted tube (laparoscope) is   used to remove the tubal pregnancy. If severe internal bleeding occurs, a cut (incision) may be made in the lower abdomen (laparotomy), and the ectopic pregnancy is removed. This stops the bleeding. Part of the fallopian tube, or the whole tube, may be removed as well (salpingectomy). After surgery, pregnancy hormone tests may be done to be sure there is no pregnancy tissue left. You may receive a RhoGAM shot if you are Rh negative and the father is Rh positive, or if you do  not know the Rh type of the father. This is to prevent problems with any future pregnancy. SEEK IMMEDIATE MEDICAL CARE IF:  You have any symptoms of an ectopic pregnancy. This is a medical emergency. Document Released: 09/24/2004 Document Revised: 08/06/2011 Document Reviewed: 04/23/2011 [employer] Patient Information 2012 [employer]. 

## 2012-02-16 NOTE — MAU Provider Note (Signed)
  History     CSN: [id number]  Arrival date and time: 02/16/12 0703   None     Chief Complaint  Patient presents with  . Follow-up   HPI Here for followup of ectopic.  Day 4 labs. OB History    Grav Para Term Preterm Abortions TAB SAB Ect Mult Living   1               Past Medical History  Diagnosis Date  . No pertinent past medical history     Past Surgical History  Procedure Date  . Wisdom tooth extraction     Family History  Problem Relation Age of Onset  . Other Neg Hx     History  Substance Use Topics  . Smoking status: Never Smoker   . Smokeless tobacco: Not on file  . Alcohol Use: Yes     occasional    Allergies: No Known Allergies  Prescriptions prior to admission  Medication Sig Dispense Refill  . oxyCODONE-acetaminophen (ROXICET) 5-325 MG per tablet Take 2 tablets by mouth every 4 (four) hours as needed for pain.  20 tablet  0    ROS Bleeding like a period Cramps  Physical Exam   Blood pressure 137/90, pulse 92, temperature 98.8 F (37.1 C), temperature source Oral, resp. rate 16, last menstrual period 12/27/2011, SpO2 100.00%.  Physical Exam Deferred Results for orders placed during the hospital encounter of 02/16/12 (from the past 24 hour(s))  HCG, QUANTITATIVE, PREGNANCY     Status: Abnormal   Collection Time   02/16/12  7:15 AM      Component Value Range   hCG, Beta Chain, Quant, S 581 (*) <5 mIU/mL   Previous Quant = 552  MAU Course  Procedures  MDM Discussed with Dr Shawnie Pons OK to proceed with plan   Assessment and Plan  A:  Ectopic pregnancy P:  Return for Day 7 labs  [facility] 02/16/2012, 8:13 AM

## 2012-02-16 NOTE — MAU Note (Signed)
Patient to MAU for day 4 BHCG s/p MTX. Denies any pain at this time but does a little cramping at times. Reports bleeding like a period.

## 2012-02-16 NOTE — MAU Provider Note (Signed)
Chart reviewed and agree with management and plan.  

## 2012-02-19 ENCOUNTER — Encounter (HOSPITAL_COMMUNITY): Payer: Self-pay | Admitting: Advanced Practice Midwife

## 2012-02-19 ENCOUNTER — Inpatient Hospital Stay (HOSPITAL_COMMUNITY)
Admission: AD | Admit: 2012-02-19 | Discharge: 2012-02-19 | Disposition: A | Discharge status: Home / Self Care | Payer: BC Managed Care – PPO | Source: Ambulatory Visit | Attending: Obstetrics and Gynecology | Admitting: Obstetrics and Gynecology

## 2012-02-19 DIAGNOSIS — O009 Unspecified ectopic pregnancy without intrauterine pregnancy: Secondary | ICD-10-CM

## 2012-02-19 DIAGNOSIS — O00109 Unspecified tubal pregnancy without intrauterine pregnancy: Secondary | ICD-10-CM | POA: Insufficient documentation

## 2012-02-19 LAB — HCG, QUANTITATIVE, PREGNANCY: hCG, Beta Chain, Quant, S: 428 m[IU]/mL — ABNORMAL HIGH (ref <5)

## 2012-02-19 MED ORDER — OXYCODONE-ACETAMINOPHEN 5-325 MG PO TABS: 1.0000 | ORAL_TABLET | ORAL | Status: AC | PRN

## 2012-02-19 NOTE — MAU Note (Signed)
Patient to MAU for repeat BHCG day 7 s/p MTX. States continues to have bleeding like a period and having cramping on and off.

## 2012-02-19 NOTE — MAU Provider Note (Signed)
  History     CSN: [id number]  Arrival date and time: 02/19/12 4098   None     Chief Complaint  Patient presents with  . Follow-up   HPI This is a 26 y.o. female at [redacted]w[redacted]d who presents for Day 7 Methotrexate labs.  States she still has some bleeding and intermittent cramping.  OB History    Grav Para Term Preterm Abortions TAB SAB Ect Mult Living   1               Past Medical History  Diagnosis Date  . No pertinent past medical history     Past Surgical History  Procedure Date  . Wisdom tooth extraction     Family History  Problem Relation Age of Onset  . Other Neg Hx     History  Substance Use Topics  . Smoking status: Never Smoker   . Smokeless tobacco: Not on file  . Alcohol Use: Yes     occasional    Allergies: No Known Allergies  Prescriptions prior to admission  Medication Sig Dispense Refill  . oxyCODONE-acetaminophen (ROXICET) 5-325 MG per tablet Take 2 tablets by mouth every 4 (four) hours as needed for pain.  20 tablet  0    ROS As listed in HPI  Physical Exam   Blood pressure 140/78, pulse 84, temperature 99.1 F (37.3 C), temperature source Oral, resp. rate 16, last menstrual period 12/27/2011, SpO2 99.00%.  Physical Exam  Constitutional: She is oriented to person, place, and time. She appears well-developed and well-nourished. No distress.  HENT:  Head: Normocephalic.  Cardiovascular: Normal rate.   Respiratory: Effort normal.  Neurological: She is alert and oriented to person, place, and time.  Skin: Skin is warm and dry.  Psychiatric: She has a normal mood and affect.   Pelvic exam not indicated  MAU Course  Procedures  MDM Discussed with Dr Jolayne Panther. This is more than a 15% drop, so pt cleared to go home and repeat quant in one week. Ectopic precautions.  Assessment and Plan  A:  Ectopic pregnancy      Appropriate decrease in Quant P:  Discussed with Dr Jolayne Panther      Will repeat in one week.   Wynelle Bourgeois 02/19/2012,  9:08 AM

## 2012-02-19 NOTE — Discharge Instructions (Signed)
Ectopic Pregnancy An ectopic pregnancy is when the fertilized egg attaches (implants) outside the uterus. Most ectopic pregnancies occur in the fallopian tube. Rarely do ectopic pregnancies occur on the ovary, intestine, pelvis, or cervix. An ectopic pregnancy does not have the ability to develop into a normal, healthy baby.  A ruptured ectopic pregnancy is one in which the fallopian tube gets torn or bursts and results in internal bleeding. Often there is intense abdominal pain, and sometimes, vaginal bleeding. Having an ectopic pregnancy can be a life-threatening experience. If left untreated, this dangerous condition can lead to a blood transfusion, abdominal surgery, or even death. CAUSES  Damage to the fallopian tubes is the suspected cause in most ectopic pregnancies.  RISK FACTORS Depending on your circumstances, the amount of risk of having an ectopic pregnancy will vary. There are 3 categories that may help you identify whether you are potentially at risk. High Risk  You have gone through infertility treatment.   You have had a previous ectopic pregnancy.   You have had previous tubal surgery.   You have had previous surgery to have the fallopian tubes tied (tubal ligation).   You have tubal problems or diseases.   You have been exposed to DES. DES is a medicine that was used until 1971 and had effects on babies whose mothers took the medicine.   You become pregnant while using an intrauterine device (IUD) for birth control.  Moderate Risk  You have a history of infertility.   You have a history of a sexually transmitted infection (STI).   You have a history of pelvic inflammatory disease (PID).   You have scarring from endometriosis.   You have multiple sexual partners.   You smoke.  Low Risk  You have had previous pelvic surgery.   You use vaginal douching.   You became sexually active before 28 years of age.  SYMPTOMS  An ectopic pregnancy should be suspected  in anyone who has missed a period and has abdominal pain or bleeding.  You may experience normal pregnancy symptoms, such as:   Nausea.   Tiredness.   Breast tenderness.   Symptoms that are not normal include:   Pain with intercourse.   Irregular vaginal bleeding or spotting.   Cramping or pain on one side, or in the lower abdomen.   Fast heartbeat.   Passing out while having a bowel movement.   Symptoms of a ruptured ectopic pregnancy and internal bleeding may include:   Sudden, severe pain in the abdomen and pelvis.   Dizziness or fainting.   Pain in the shoulder area.  DIAGNOSIS  Tests that may be performed include:  A pregnancy test.   An ultrasound.   Testing the specific level of pregnancy hormone in the bloodstream.   Taking a sample of uterus tissue (dilation and curettage, D&C).   Surgery to perform a visual exam of the inside of the abdomen using a lighted tube (laparoscopy).  TREATMENT  An injection of methotrexate medicine may be given. This is given if:  The diagnosis is made early.   The fallopian tube has not ruptured.   You are considered to be a good candidate for the medicine.  Usually, pregnancy hormone blood levels are checked after methotrexate treatment. This is to be sure the medicine is effective. It may take 4 to 6 weeks for the pregnancy to be absorbed (though most pregnancies will be absorbed by 3 weeks). Surgical treatment may be needed. A lighted tube (laparoscope) is  used to remove the tubal pregnancy. If severe internal bleeding occurs, a cut (incision) may be made in the lower abdomen (laparotomy), and the ectopic pregnancy is removed. This stops the bleeding. Part of the fallopian tube, or the whole tube, may be removed as well (salpingectomy). After surgery, pregnancy hormone tests may be done to be sure there is no pregnancy tissue left. You may receive a RhoGAM shot if you are Rh negative and the father is Rh positive, or if you do  not know the Rh type of the father. This is to prevent problems with any future pregnancy. SEEK IMMEDIATE MEDICAL CARE IF:  You have any symptoms of an ectopic pregnancy. This is a medical emergency. Document Released: 09/24/2004 Document Revised: 08/06/2011 Document Reviewed: 04/23/2011 [facility] Patient Information 2012 [place], [place].

## 2012-02-23 NOTE — MAU Provider Note (Signed)
Agree with above note.  Joanna Page 02/23/2012 7:27 AM   

## 2012-02-26 ENCOUNTER — Inpatient Hospital Stay (HOSPITAL_COMMUNITY)
Admission: AD | Admit: 2012-02-26 | Discharge: 2012-02-26 | Disposition: A | Discharge status: Home / Self Care | Payer: BC Managed Care – PPO | Source: Ambulatory Visit | Attending: Family Medicine | Admitting: Family Medicine

## 2012-02-26 ENCOUNTER — Encounter (HOSPITAL_COMMUNITY): Payer: Self-pay | Admitting: *Deleted

## 2012-02-26 DIAGNOSIS — O00109 Unspecified tubal pregnancy without intrauterine pregnancy: Secondary | ICD-10-CM | POA: Insufficient documentation

## 2012-02-26 LAB — HCG, QUANTITATIVE, PREGNANCY: hCG, Beta Chain, Quant, S: 140 m[IU]/mL — ABNORMAL HIGH (ref <5)

## 2012-02-26 NOTE — MAU Provider Note (Signed)
History   Chief Complaint:  Follow-up   Joanna Page is  26 y.o. G1P0010 Patient's last menstrual period was 12/27/2011.Marland Kitchen Patient is here for follow up of quantitative HCG and ongoing surveillance of pregnancy status.  She is 13 days S/P MTX for ectopic pregnancy.   Since her last visit, the patient is without new complaint.   The patient reports bleeding as  spotting.  No abd pain.  General ROS:  negative  Her previous Quantitative HCG values are:  Results for Joanna, Page (MRN [id number]) as of 02/26/2012 07:53  Ref. Range 02/11/2012 19:55 02/13/2012 20:25 02/16/2012 07:15 02/19/2012 07:44  hCG, Beta Chain, Quant, S Latest Range: <5 mIU/mL 539 (H) 552 (H) 581 (H) 428 (H)    Physical Exam   Blood pressure 133/77, pulse 86, temperature 98.7 F (37.1 C), temperature source Oral, resp. rate 18, height 5\' 10"  (1.778 m), weight 103.874 kg (229 lb), last menstrual period 12/27/2011, unknown if currently breastfeeding.  Focused Gynecological Exam: examination not indicated  Labs: Recent Results (from the past 24 hour(s))  HCG, QUANTITATIVE, PREGNANCY   Collection Time   02/26/12  7:05 AM      Component Value Range   hCG, Beta Chain, Quant, S 140 (*) <5 mIU/mL    Assessment: Ectopic pregnancy 13 days S/P MTX w/ appropriate drop in quants.   Plan: F/U weekly quants in out patient clinic. Called pt w/ results and instructions  Joanna Page 02/26/2012, 8:16 AM

## 2012-02-26 NOTE — MAU Note (Signed)
Pt in for weekly HCG lab work following MTX inj for ectopic pregnancy.  Denies pain, bleeding is very minimal.

## 2012-02-29 NOTE — MAU Provider Note (Signed)
Chart reviewed and agree with management and plan.  

## 2012-03-04 ENCOUNTER — Inpatient Hospital Stay (HOSPITAL_COMMUNITY)
Admission: AD | Admit: 2012-03-04 | Discharge: 2012-03-04 | Disposition: A | Discharge status: Home / Self Care | Payer: BC Managed Care – PPO | Source: Ambulatory Visit | Attending: Obstetrics & Gynecology | Admitting: Obstetrics & Gynecology

## 2012-03-04 DIAGNOSIS — Z09 Encounter for follow-up examination after completed treatment for conditions other than malignant neoplasm: Secondary | ICD-10-CM

## 2012-03-04 DIAGNOSIS — O00109 Unspecified tubal pregnancy without intrauterine pregnancy: Secondary | ICD-10-CM | POA: Insufficient documentation

## 2012-03-04 NOTE — MAU Note (Signed)
Patient phoned with the results. Instructed to follow up with her primary MD.

## 2012-03-04 NOTE — MAU Provider Note (Signed)
Joanna Page is a 26 y.o. female @ [redacted]w[redacted]d by LMP who presents to MAU for follow up Bhcg S/P MTX.  This was scheduled for out patient. The Bhcg has been falling. The patient denies pain. She has some bleeding. Her Bhcg on 6/18 was 581 and then dropped to 428 and one week ago down to 140. We will call her with her results today. She is a regular patient with [facility] in [place]. If her numbers have dropped to zero she will follow up there. Marland Kitchen Results for orders placed during the hospital encounter of 03/04/12 (from the past 24 hour(s))  HCG, QUANTITATIVE, PREGNANCY     Status: Abnormal   Collection Time   03/04/12 12:17 PM      Component Value Range   hCG, Beta Chain, Quant, S 34 (*) <5 mIU/mL   I discussed the results with Dr. Macon Large and we will have the patient follow up with her doctor in [place].

## 2012-03-04 NOTE — MAU Provider Note (Signed)
Attestation of Attending Supervision of Advanced Practitioner (CNM/NP): Evaluation and management procedures were performed by the Advanced Practitioner under my supervision and collaboration.  I have reviewed the Advanced Practitioner's note and chart, and I agree with the management and plan.  Coulson Wehner, M.D. 03/04/2012 1:58 PM  

## 2012-03-04 NOTE — MAU Note (Signed)
Patient to MAU for a outpatient only lab. Was scheduled to be done at the [facility] but she did not receive a call. Patient denies any pain or bleeding. Will call her with the results.

## 2012-03-07 ENCOUNTER — Other Ambulatory Visit: Payer: BC Managed Care – PPO

## 2013-06-03 ENCOUNTER — Inpatient Hospital Stay: Payer: Self-pay | Admitting: Advanced Practice Midwife

## 2013-06-03 LAB — CBC WITH DIFFERENTIAL/PLATELET
Basophil %: 0.4 %
Eosinophil %: 0.4 %
Lymphocyte #: 1.6 10*3/uL (ref 1.0–3.6)
MCH: 33 pg (ref 26.0–34.0)
MCV: 93 fL (ref 80–100)
Monocyte %: 6.2 %
Neutrophil #: 7 10*3/uL — ABNORMAL HIGH (ref 1.4–6.5)
Neutrophil %: 75.4 %
RBC: 3.6 10*6/uL — ABNORMAL LOW (ref 3.80–5.20)
RDW: 12.9 % (ref 11.5–14.5)

## 2013-06-23 IMAGING — US US OB TRANSVAGINAL
1 series · 13 of 28 positions shown · non-contrast
Comparison: CT of 08/21/2005.

CLINICAL DATA: 6-week 4 days pregnant by last menstrual period.
Bleeding and passing clots.  Beta HCG pending.

OBSTETRIC <14 WK US AND TRANSVAGINAL OB US
TECHNIQUE: Both transabdominal and transvaginal ultrasound
examinations were performed for complete evaluation of the
gestation as well as the maternal uterus, adnexal regions, and
pelvic cul-de-sac.  Transvaginal technique was performed to assess
early pregnancy.

[Series 1: us ob comp less 14 wks · 66 acquisitions, 13 frames shown]
[im 3/66]
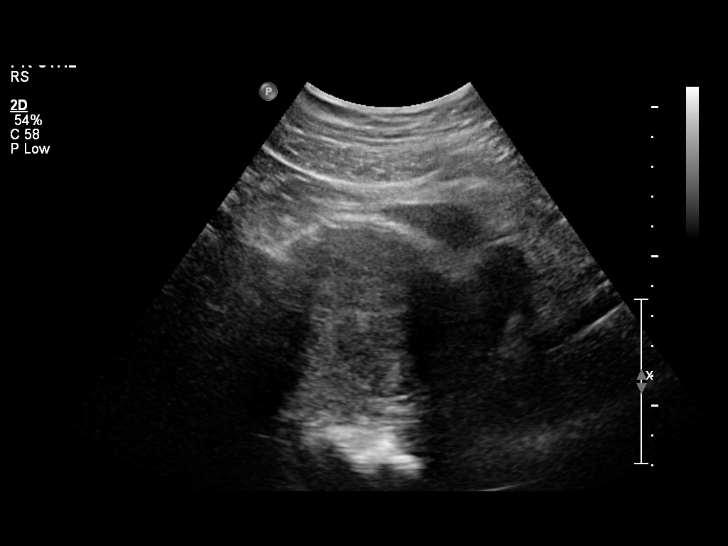
[im 8/66]
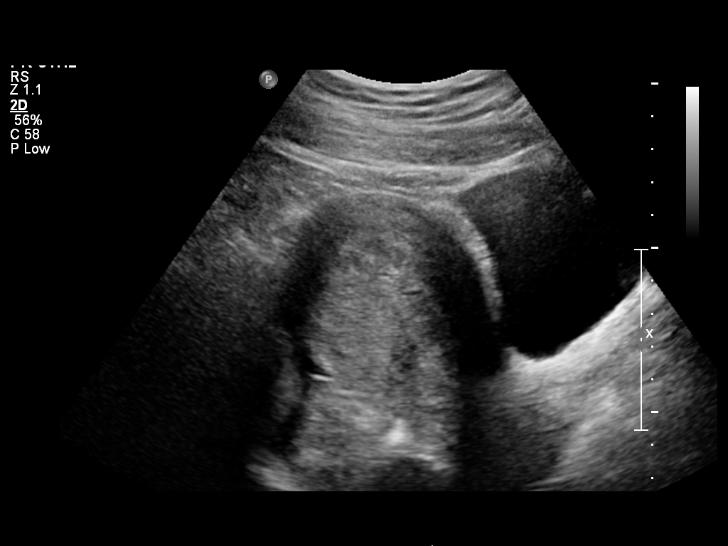
[im 13/66]
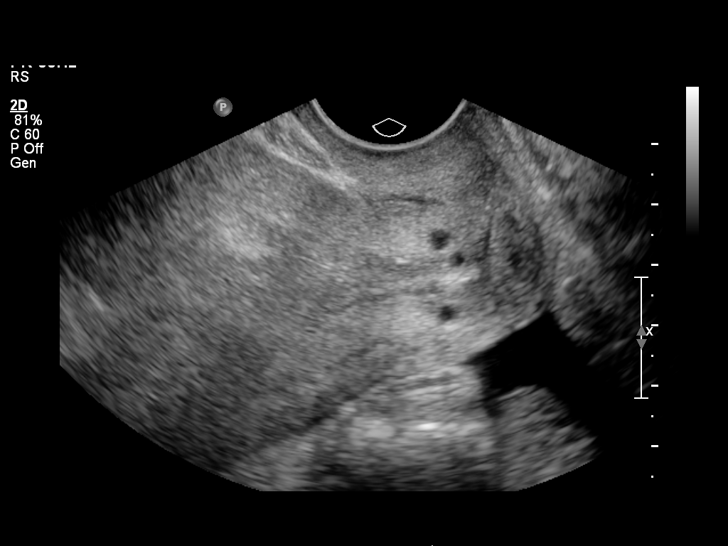
[im 17/66]
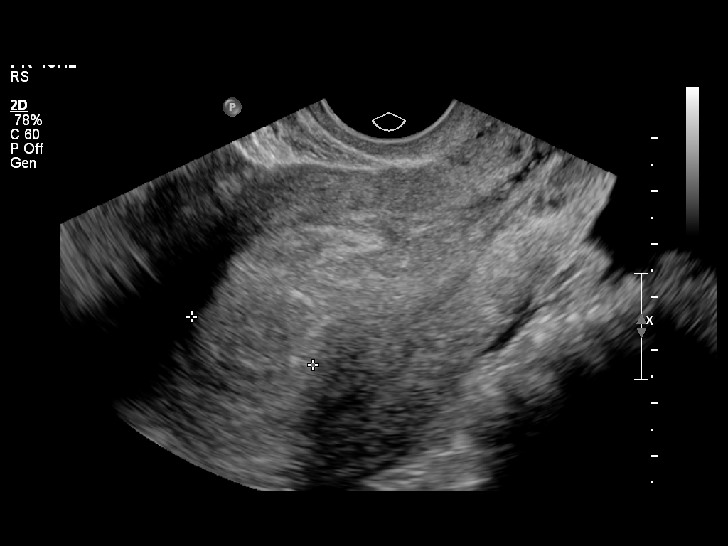
[im 22/66]
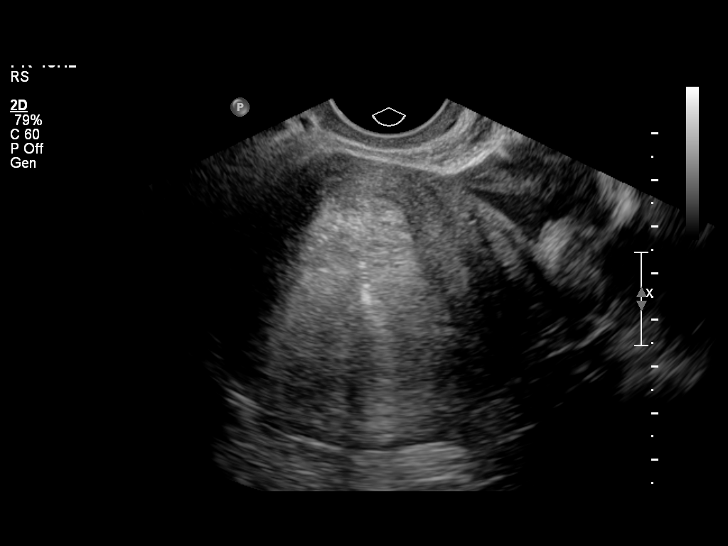
[im 27/66]
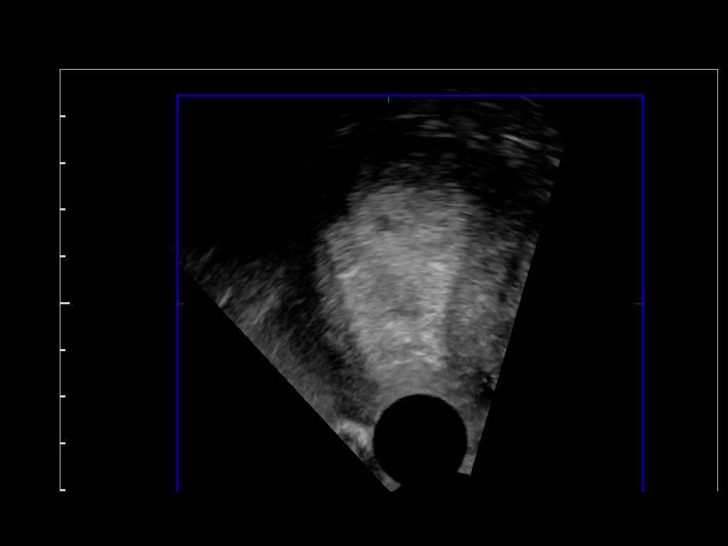
[im 34/66]
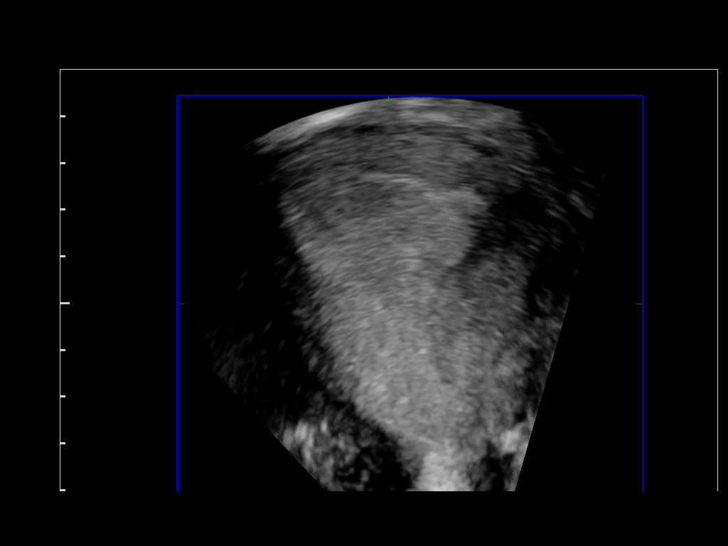
[im 39/66]
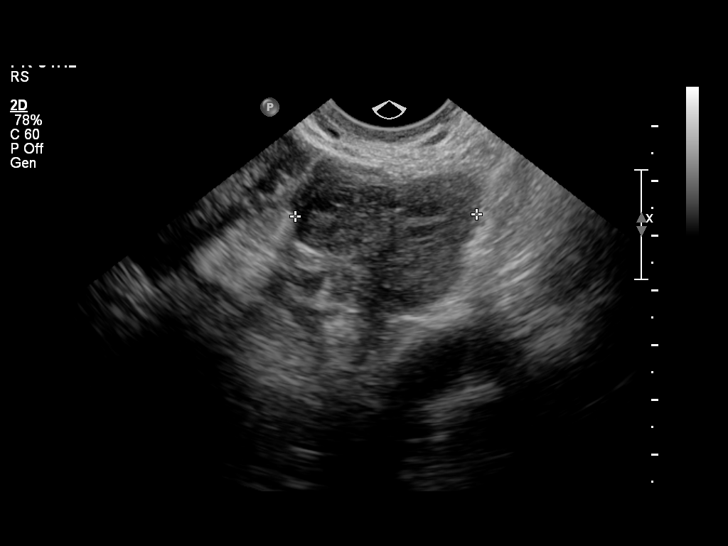
[im 44/66]
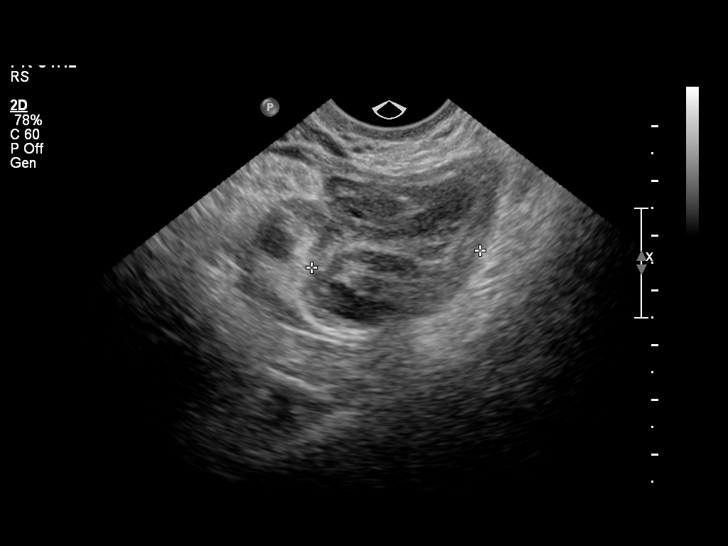
[im 49/66]
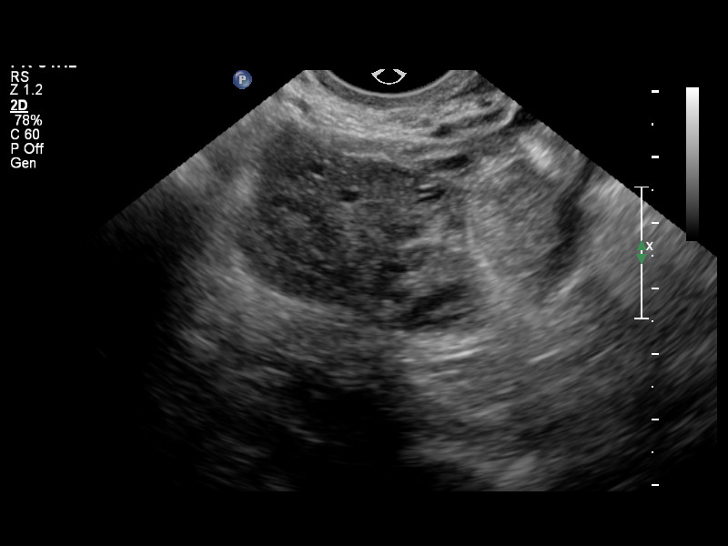
[im 53/66]
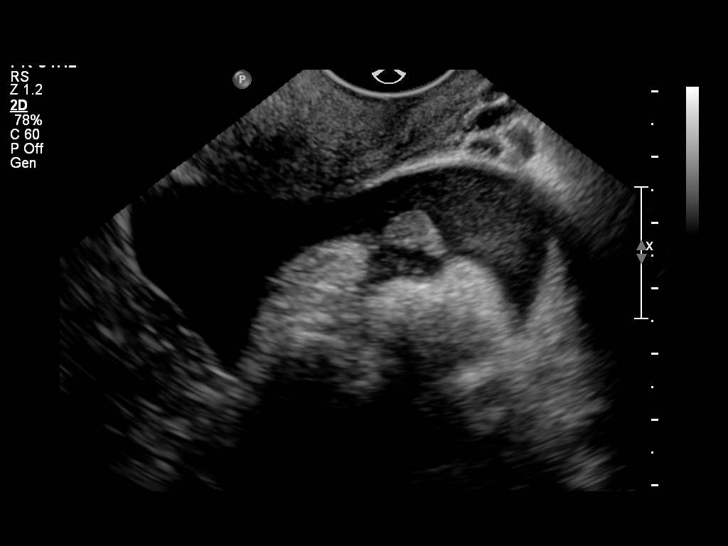
[im 58/66]
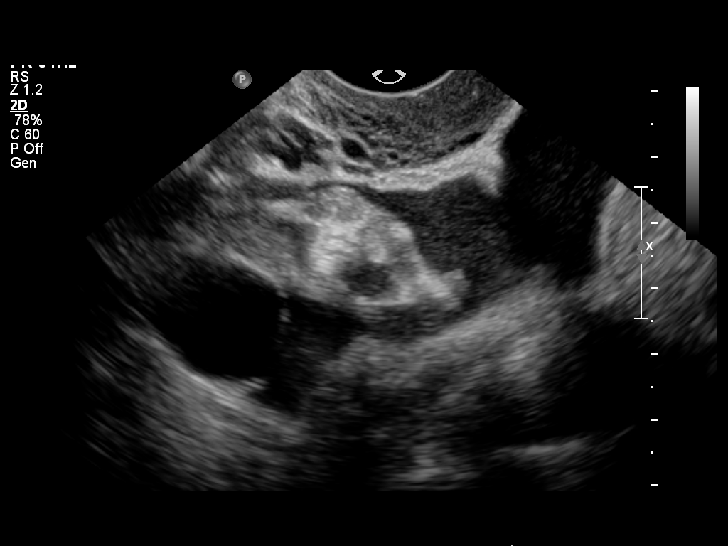
[im 63/66]
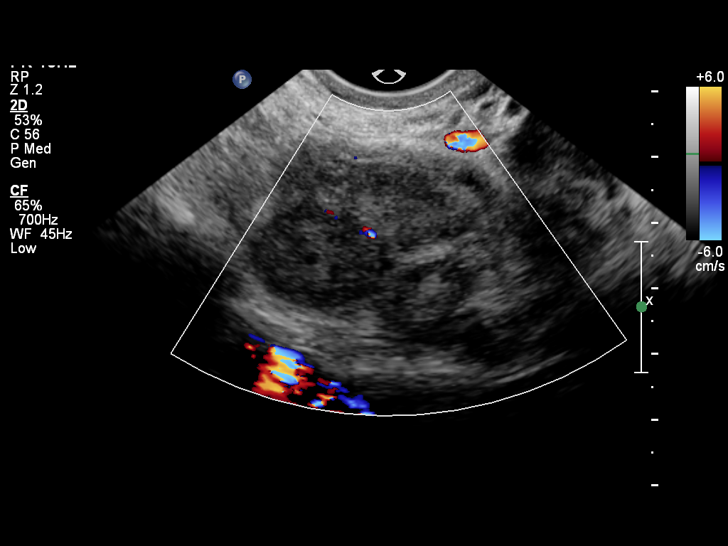

[13 of 28 positions shown; findings below may reference images not displayed]

Intrauterine gestational sac:  Absent
Yolk sac: Absent
Embryo: Absent; endometrial thickening at up to 2.0 cm.

Maternal uterus/adnexae:
The right ovary / adnexa is within normal limits.  Along the
inferior aspect of the left ovary is a 3.5 x 2.4 x 3.1 cm
heterogeneous "mass."  There is suggestion of minimal vascularity
on image 46.

There is a moderate amount of complex pelvic fluid.
IMPRESSION: 1.  Lack of visualization of the gestational sac or intrauterine
pregnancy.  Differential considerations include early intrauterine
pregnancy, missed abortion, and ectopic pregnancy.  Correlation
with ongoing beta HCG and possibly pelvic ultrasound follow-up
should be considered.
2.  Nonspecific left adnexal "mass."  Concurrent complex moderate
volume pelvic fluid.  Differential considerations include rupture
of a hemorrhagic corpus luteal cyst or otherwise occult ectopic
pregnancy.  Recommend clinical correlation, as the clinical history
does not describe pain or other symptoms to suggest a ruptured
ectopic pregnancy.

## 2014-06-17 LAB — OB RESULTS CONSOLE GC/CHLAMYDIA
CHLAMYDIA, DNA PROBE: NEGATIVE
Gonorrhea: NEGATIVE

## 2014-07-02 ENCOUNTER — Encounter (HOSPITAL_COMMUNITY): Payer: Self-pay | Admitting: *Deleted

## 2014-07-05 LAB — OB RESULTS CONSOLE VARICELLA ZOSTER ANTIBODY, IGG: Varicella: IMMUNE

## 2014-07-05 LAB — OB RESULTS CONSOLE HEPATITIS B SURFACE ANTIGEN: Hepatitis B Surface Ag: NEGATIVE

## 2014-07-05 LAB — OB RESULTS CONSOLE ABO/RH: RH Type: POSITIVE

## 2014-07-05 LAB — OB RESULTS CONSOLE RPR: RPR: NONREACTIVE

## 2014-07-05 LAB — OB RESULTS CONSOLE RUBELLA ANTIBODY, IGM: RUBELLA: IMMUNE

## 2014-07-05 LAB — OB RESULTS CONSOLE HIV ANTIBODY (ROUTINE TESTING): HIV: NONREACTIVE

## 2014-08-31 NOTE — L&D Delivery Note (Signed)
Operative Delivery Note At 7:22 AM a viable female was delivered via VBAC, Vacuum Assisted.  Presentation: vertex; Position: Left,, Occiput,, Anterior;  Fetal hearts were down to the 50s during contractions, with return to baseline of 150s in-between.  At one point, the hearts were slow to rise and not during a contraction, and the decision to assist the delivery was made.  One pull was made with good descent of the head, and the vacuum was removed.  Verbal consent: obtained from patient.  Risks and benefits discussed in detail.  Risks include, but are not limited to the risks of anesthesia, bleeding, infection, damage to maternal tissues, fetal cephalhematoma, sub-galeal bleed.  There is also the risk of inability to effect vaginal delivery of the head, or shoulder dystocia that cannot be resolved by established maneuvers, leading to the need for emergency cesarean section.  APGAR: 8, 9; weight  .   Placenta status: , Spontaneous.   Cord: 3 vessels with the following complications: none  Anesthesia: None  Instruments: flat Kiwi Episiotomy: None Lacerations: 2nd degree, left labial Suture Repair: 3.0 vicryl Est. Blood Loss (mL): 400cc    Mom to postpartum.  Baby to Couplet care / Skin to Skin.  Shawnika Pepin C 02/06/2015, 8:32 AM

## 2014-12-21 NOTE — Op Note (Signed)
PATIENT NAME:  Joanna Page, Joanna Page MR#:  [id number] DATE OF BIRTH:  1986-01-07  DATE OF PROCEDURE:  06/04/2013  PREOPERATIVE DIAGNOSES:  1.  G2, P0-0-1-0, at 37 weeks 4 days' gestation.  2.  Premature rupture of membranes with Pitocin augmentation.  3.  Recurrent deep variables with a nadir to the 30s, 40s, unresponsive to position changes and amnioinfusion.   POSTOPERATIVE DIAGNOSES:  1.  G2, P0-0-1-0, at 37 weeks 4 days' gestation.  2.  Premature rupture of membranes with Pitocin augmentation.  3.  Recurrent deep variables with a nadir to the 30s, 40s, unresponsive to position changes and amnioinfusion.  4.  Nuchal cord x2.   PROCEDURE PERFORMED: Primary low transverse cesarean section via Pfannenstiel skin incision.   ANESTHESIA: Epidural.   PRIMARY SURGEON: Florina Oundreas M. Bonney AidStaebler, M.D.   ASSISTANT: Farrel Connersolleen Gutierrez, CNM.  PREOPERATIVE ANTIBIOTICS: 2 g Ancef.   DRAINS OR TUBES: Foley to gravity drainage, On-Q catheter system.   IMPLANTS: None.   ESTIMATED BLOOD LOSS: 800 mL.   OPERATIVE FLUIDS: 1500 mL of crystalloid.   URINE OUTPUT: 650 mL of clear urine.   COMPLICATIONS: None.   INTRAOPERATIVE FINDINGS: Normal tubes, ovaries, and uterus. Nuchal cord x2 which was able to be reduced upon delivery of the head. The uterus was unable to be exteriorized secondary to size. The hysterotomy incision required a third-layer closure for hemostasis. The Foley was visualized in the bladder post hysterotomy repair with clear urine in the Foley.   Delivery resulted in the birth of a liveborn female infant weighing 3470 grams, 7 pounds 10 ounces. Apgars 8 and 9.   SPECIMENS REMOVED: None.   CONDITION FOLLOWING PROCEDURE: Stable.   PROCEDURE IN DETAIL: Risks, benefits, and alternatives of the procedure were discussed with the patient prior to proceeding to the operating room. The patient was taken to the operating room where her epidural anesthesia was dosed. She was positioned in the supine  position, prepped and draped in the usual sterile fashion. A timeout procedure was performed and the level of anesthetic was checked and noted to be adequate.   A Pfannenstiel skin incision was made 2 cm above the pubic symphysis, carried down sharply to the level of the rectus fascia using a knife. The fascia was incised in the midline using the knife and then extended using Mayo scissors. The superior border of the rectus fascia was grasped with 2 Kocher clamps. The underlying rectus muscles were dissected off the rectus fascia using blunt dissection, median raphe incised using Mayo scissors. The inferior border of the rectus fascia was dissected off the rectus muscles in a similar fashion. The midline was identified. The peritoneum was entered bluntly and extended using manual traction. The bladder blade was placed. A bladder flap was then created using Metzenbaum scissors, further developed digitally before replacing the bladder blade to displace the bladder caudally.   A low transverse incision was made on the uterus using the scalpel. The uterus was entered bluntly using the operator's finger. The hysterotomy incision was extended using manual traction. Upon placing the operator's hand into the hysterotomy incision, the infant was noted to be in the OA position. Vertex was grasped, flexed, brought to the incision, delivered atraumatically using fundal pressure. The infant was suctioned. The cord was noted to be wrapped around the infant's neck twice, was reduced without difficulty. The remainder of the body delivered with ease. The cord was clamped and cut. The infant was passed to the awaiting pediatricians. Cord blood was obtained. The placenta  was delivered using manual extraction. The uterus was wiped clean of clots and debris using 2 moist laps.   The uterus was unable to be exteriorized through the incision given the size of the uterus and the  size of the incision. The hysterotomy incision was  therefore closed with the uterus in situ. After the second layer, there was still some oozing from the lower edge of the hysterotomy incision. A third layer was undertaken to achieve adequate hemostasis.   Following this, the abdomen was cleaned of clots and debris using 2 moist laps. The rectus muscles were reapproximated in the midline using a 2-0 Vicryl mattress stitch. The On-Q catheters were placed per the usual protocol. The rectus muscles were inspected and noted to be hemostatic. Fascia was closed using a looped #1 PDS in a running fashion. The subcutaneous tissue was irrigated and hemostasis achieved using the Bovie. Skin was closed using the Insorb stapler. Each On-Q catheter was then bolused with 5 mL of 0.5% bupivacaine. Sponge, needle, and instrument counts were correct x2. The patient tolerated the procedure well and was taken to the recovery room in stable condition.    ____________________________ Florina Ou. Bonney Aid, MD ams:np D: 06/06/2013 18:27:00 ET T: 06/06/2013 18:54:38 ET JOB#: [id number]  cc: Florina Ou. Bonney Aid, MD, <Dictator> [place] Cathrine Muster MD ELECTRONICALLY SIGNED 06/14/2013 21:57

## 2015-01-08 NOTE — H&P (Signed)
L&D Evaluation:  History:  HPI 29 year old G2 P0010 with EDC=06/21/2013 and LMP=09/14/2012 presents at 3437 3/7 weeks with c/o SROM clear fluid at 0515 this AM. Not experiencing painful ctxs. Deneis VB, dysuria, URI sx. Baby active. [facility] for BMI=32, an elevated early one hr GTT(157) with a normal 3hr GTT x2, mild anemia with an Hct=30%, and GBS positive. LAbs: A pos, RI, VI. Had TDAP on 04/12/2013.   Presents with leaking fluid   Patient's Medical History Hx of ectopic 01/2012-treated with Methotrexate., Obesity   Patient's Surgical History none   Medications Pre Natal Vitamins  Zantac   Allergies NKDA   Social History none   Family History Non-Contributory   ROS:  ROS see HPI   Exam:  Vital Signs stable  121/78. afebrile   Urine Protein not completed   General no apparent distress   Mental Status clear   Chest clear   Heart normal sinus rhythm, no murmur/gallop/rubs   Abdomen gravid, non-tender   Estimated Fetal Weight Average for gestational age, 317 1/2   Fetal Position cephalic   Edema no edema   Reflexes 2+   Pelvic no external lesions, +gross rupture: positive Nitrazine. 3/50%/-2 per RN exam   Mebranes Ruptured   Description clear   FHT normal rate with no decels, 145=150 with accels to 170   FHT Description mod variability   Ucx occ   Skin dry   Impression:  Impression IUP at 37 3/7 weeks with PROM. +GBS   Plan:  Plan EFM/NST, monitor contractions and for cervical change, antibiotics for GBBS prophylaxis, Can be up ambulating with intermittent monitoring. If NIL by 1200, will start Pitocin augmentation.   Electronic Signatures: Trinna BalloonGutierrez, Jawuan Robb L (CNM)  (Signed 04-Oct-14 09:27)  Authored: L&D Evaluation   Last Updated: 04-Oct-14 09:27 by Trinna BalloonGutierrez, Breyonna Nault L (CNM)

## 2015-01-22 LAB — OB RESULTS CONSOLE GBS: STREP GROUP B AG: POSITIVE

## 2015-02-06 ENCOUNTER — Encounter: Payer: Self-pay | Admitting: *Deleted

## 2015-02-06 ENCOUNTER — Inpatient Hospital Stay
Admission: EM | Admit: 2015-02-06 | Discharge: 2015-02-08 | DRG: 775 | Disposition: A | Discharge status: Home / Self Care | Payer: 59 | Attending: Obstetrics & Gynecology | Admitting: Obstetrics & Gynecology

## 2015-02-06 DIAGNOSIS — Z3A39 39 weeks gestation of pregnancy: Secondary | ICD-10-CM | POA: Diagnosis present

## 2015-02-06 DIAGNOSIS — Z98891 History of uterine scar from previous surgery: Secondary | ICD-10-CM

## 2015-02-06 DIAGNOSIS — O99820 Streptococcus B carrier state complicating pregnancy: Secondary | ICD-10-CM | POA: Diagnosis present

## 2015-02-06 DIAGNOSIS — O3421 Maternal care for scar from previous cesarean delivery: Principal | ICD-10-CM | POA: Diagnosis present

## 2015-02-06 DIAGNOSIS — Z3490 Encounter for supervision of normal pregnancy, unspecified, unspecified trimester: Secondary | ICD-10-CM

## 2015-02-06 LAB — CBC
HCT: 35.8 % (ref 35.0–47.0)
Hemoglobin: 12.2 g/dL (ref 12.0–16.0)
MCH: 32.7 pg (ref 26.0–34.0)
MCHC: 34.2 g/dL (ref 32.0–36.0)
MCV: 95.7 fL (ref 80.0–100.0)
PLATELETS: 271 10*3/uL (ref 150–440)
RBC: 3.74 MIL/uL — ABNORMAL LOW (ref 3.80–5.20)
RDW: 12.6 % (ref 11.5–14.5)
WBC: 10.3 10*3/uL (ref 3.6–11.0)

## 2015-02-06 LAB — TYPE AND SCREEN
ABO/RH(D): A POS
Antibody Screen: NEGATIVE

## 2015-02-06 LAB — ABO/RH: ABO/RH(D): A POS

## 2015-02-06 MED ORDER — CITRIC ACID-SODIUM CITRATE 334-500 MG/5ML PO SOLN: 30.0000 mL | ORAL | Status: DC | PRN

## 2015-02-06 MED ORDER — ACETAMINOPHEN 325 MG PO TABS: 650.0000 mg | ORAL_TABLET | ORAL | Status: DC | PRN

## 2015-02-06 MED ORDER — ONDANSETRON HCL 4 MG/2ML IJ SOLN: 4.0000 mg | INTRAMUSCULAR | Status: DC | PRN

## 2015-02-06 MED ORDER — SENNOSIDES-DOCUSATE SODIUM 8.6-50 MG PO TABS
2.0000 | ORAL_TABLET | ORAL | Status: DC
  Administered 2015-02-07 (×2): 2 via ORAL
  Filled 2015-02-06 (×2): qty 2

## 2015-02-06 MED ORDER — SODIUM CHLORIDE 0.9 % IV SOLN
1.0000 g | Freq: Four times a day (QID) | INTRAVENOUS | Status: DC
  Filled 2015-02-06 (×5): qty 1000

## 2015-02-06 MED ORDER — PRENATAL MULTIVITAMIN CH: 1.0000 | ORAL_TABLET | Freq: Every day | ORAL | Status: DC

## 2015-02-06 MED ORDER — OXYTOCIN 40 UNITS IN LACTATED RINGERS INFUSION - SIMPLE MED: 62.5000 mL/h | Freq: Once | INTRAVENOUS | Status: DC

## 2015-02-06 MED ORDER — FERROUS SULFATE 325 (65 FE) MG PO TABS
325.0000 mg | ORAL_TABLET | Freq: Two times a day (BID) | ORAL | Status: DC
  Administered 2015-02-06 – 2015-02-07 (×2): 325 mg via ORAL
  Filled 2015-02-06 (×2): qty 1

## 2015-02-06 MED ORDER — SIMETHICONE 80 MG PO CHEW: 80.0000 mg | CHEWABLE_TABLET | ORAL | Status: DC | PRN

## 2015-02-06 MED ORDER — DIBUCAINE 1 % RE OINT: 1.0000 "application " | TOPICAL_OINTMENT | RECTAL | Status: DC | PRN

## 2015-02-06 MED ORDER — LANOLIN HYDROUS EX OINT: TOPICAL_OINTMENT | CUTANEOUS | Status: DC | PRN

## 2015-02-06 MED ORDER — OXYTOCIN 40 UNITS IN LACTATED RINGERS INFUSION - SIMPLE MED
INTRAVENOUS | Status: AC
  Administered 2015-02-06: 40 [IU]
  Filled 2015-02-06: qty 1000

## 2015-02-06 MED ORDER — LIDOCAINE HCL (PF) 1 % IJ SOLN
30.0000 mL | INTRAMUSCULAR | Status: DC | PRN
  Filled 2015-02-06: qty 30

## 2015-02-06 MED ORDER — WITCH HAZEL-GLYCERIN EX PADS: 1.0000 "application " | MEDICATED_PAD | CUTANEOUS | Status: DC | PRN

## 2015-02-06 MED ORDER — ZOLPIDEM TARTRATE 5 MG PO TABS: 5.0000 mg | ORAL_TABLET | Freq: Every evening | ORAL | Status: DC | PRN

## 2015-02-06 MED ORDER — TETANUS-DIPHTH-ACELL PERTUSSIS 5-2.5-18.5 LF-MCG/0.5 IM SUSP: 0.5000 mL | Freq: Once | INTRAMUSCULAR | Status: DC

## 2015-02-06 MED ORDER — MAGNESIUM HYDROXIDE 400 MG/5ML PO SUSP: 30.0000 mL | ORAL | Status: DC | PRN

## 2015-02-06 MED ORDER — OXYTOCIN BOLUS FROM INFUSION: 500.0000 mL | INTRAVENOUS | Status: DC

## 2015-02-06 MED ORDER — ACETAMINOPHEN 325 MG PO TABS
650.0000 mg | ORAL_TABLET | ORAL | Status: DC | PRN
Start: 2015-02-06 — End: 2015-02-08

## 2015-02-06 MED ORDER — OXYTOCIN 40 UNITS IN LACTATED RINGERS INFUSION - SIMPLE MED: 62.5000 mL/h | INTRAVENOUS | Status: DC

## 2015-02-06 MED ORDER — LACTATED RINGERS IV SOLN: INTRAVENOUS | Status: DC

## 2015-02-06 MED ORDER — OXYCODONE-ACETAMINOPHEN 5-325 MG PO TABS: 1.0000 | ORAL_TABLET | ORAL | Status: DC | PRN

## 2015-02-06 MED ORDER — LACTATED RINGERS IV SOLN: 500.0000 mL | INTRAVENOUS | Status: DC | PRN

## 2015-02-06 MED ORDER — DIPHENHYDRAMINE HCL 25 MG PO CAPS: 25.0000 mg | ORAL_CAPSULE | Freq: Four times a day (QID) | ORAL | Status: DC | PRN

## 2015-02-06 MED ORDER — OXYTOCIN 40 UNITS IN LACTATED RINGERS INFUSION - SIMPLE MED
INTRAVENOUS | Status: AC
  Filled 2015-02-06: qty 1000

## 2015-02-06 MED ORDER — AMMONIA AROMATIC IN INHA
RESPIRATORY_TRACT | Status: AC
  Filled 2015-02-06: qty 10

## 2015-02-06 MED ORDER — MISOPROSTOL 200 MCG PO TABS
ORAL_TABLET | ORAL | Status: AC
  Filled 2015-02-06: qty 4

## 2015-02-06 MED ORDER — ONDANSETRON HCL 4 MG/2ML IJ SOLN: 4.0000 mg | Freq: Four times a day (QID) | INTRAMUSCULAR | Status: DC | PRN

## 2015-02-06 MED ORDER — IBUPROFEN 600 MG PO TABS
600.0000 mg | ORAL_TABLET | Freq: Four times a day (QID) | ORAL | Status: DC
  Administered 2015-02-06 – 2015-02-07 (×3): 600 mg via ORAL
  Filled 2015-02-06 (×5): qty 1

## 2015-02-06 MED ORDER — ONDANSETRON HCL 4 MG PO TABS: 4.0000 mg | ORAL_TABLET | ORAL | Status: DC | PRN

## 2015-02-06 MED ORDER — LIDOCAINE HCL (PF) 1 % IJ SOLN
INTRAMUSCULAR | Status: AC
  Filled 2015-02-06: qty 30

## 2015-02-06 MED ORDER — BENZOCAINE-MENTHOL 20-0.5 % EX AERO
1.0000 "application " | INHALATION_SPRAY | CUTANEOUS | Status: DC | PRN
  Filled 2015-02-06 (×2): qty 56

## 2015-02-06 MED ORDER — OXYCODONE-ACETAMINOPHEN 5-325 MG PO TABS
2.0000 | ORAL_TABLET | ORAL | Status: DC | PRN
Start: 2015-02-06 — End: 2015-02-07

## 2015-02-06 MED ORDER — OXYTOCIN 10 UNIT/ML IJ SOLN
INTRAMUSCULAR | Status: AC
  Filled 2015-02-06: qty 2

## 2015-02-06 MED ORDER — OXYCODONE-ACETAMINOPHEN 5-325 MG PO TABS: 2.0000 | ORAL_TABLET | ORAL | Status: DC | PRN

## 2015-02-06 MED ORDER — SODIUM CHLORIDE 0.9 % IV SOLN
INTRAVENOUS | Status: AC
  Administered 2015-02-06: 2 g
  Filled 2015-02-06: qty 2000

## 2015-02-06 NOTE — H&P (Signed)
OB History & Physical   History of Present Illness:  Chief Complaint:   HPI:  Joanna Page is a 29 y.o. [phone] female at 701w0d dated by LMP c/w early US with EDC of 02/13/2015.  Her pregnancy has been uncomplicated.  She presents to L&D in active labor and desires to attempt vaginal delivery after history of cesarean due to fetal distress.   SROM @ ~4am with contractions spontaneous after this.  Patient presents with cervical dilation of 8cm.  Denies pain in between contractions.  Maternal Medical History:   Past Medical History  Diagnosis Date  . No pertinent past medical history     Past Surgical History  Procedure Laterality Date  . Wisdom tooth extraction      No Known Allergies  Prior to Admission medications   Medication Sig Start Date End Date Taking? Authorizing Provider  Prenatal Vit-Fe Fumarate-FA (PRENATAL MULTIVITAMIN) TABS tablet Take 1 tablet by mouth daily at 12 noon.   Yes Historical Provider, MD     OB History  Gravida Para Term Preterm AB SAB TAB Ectopic Multiple Living  3 1 1  1   1  0 2    # Outcome Date GA Lbr Len/2nd Weight Sex Delivery Anes PTL Lv  3 Term 02/06/15 471w0d   M VBAC, Vacuum None  Y  2 Gravida 06/04/13    M CS-LTranv   Y     Complications: Fetal Intolerance  1 Ectopic               Prenatal care site: [facility]  Social History: She  reports that she has never smoked. She does not have any smokeless tobacco history on file. She reports that she drinks alcohol. She reports that she does not use illicit drugs.  Family History: family history is negative for Other.   Review of Systems: Negative x 10 systems reviewed except as noted in the HPI.       Physical Exam:  Vital Signs: BP 119/81 mmHg  Pulse 86  LMP 05/09/2014  Breastfeeding General: no acute distress.  HEENT: normocephalic, atraumatic Heart: regular rate & rhythm.  No murmurs/rubs/gallops Lungs: clear to auscultation bilaterally, normal respiratory effort  between contractions Abdomen: soft, gravid, non-tender;  EFW: AGA Extremities: non-tender Neurologic: Alert & oriented x 3.    Pertinent Results:  Prenatal Labs: Blood type/Rh A positive  Antibody screen neg  Rubella Varicella Immune immune  RPR NR  HBsAg neg  HIV neg  GC neg  Chlamydia neg  Genetic screening declined  1 hour GTT 142  3 hour GTT normal  GBS Pos   Baseline FHR: 145 beats    Variability: moderate  No accelerations +deep variables with contractions.    Assessment:  Joanna Page is a [age] y.o. [phone] female at 721w0d with active labor, TOLAC.   Plan:  1. Admit to Labor & Delivery, expect imminent delivery. 2. CBC, T&S, Clrs, IVF 3. GBS pos - attempt to get abx on board if possible.   4. Consents obtained. 5. Category 2 strip - watchful for fetal distress.  Moderate variability suggests no fetal acidemia. 6. TOLAC - notify NICU and anesthesia of patient in house   [name], Elenora FenderChelsea C  02/06/2015 8:46 AM

## 2015-02-06 NOTE — Discharge Instructions (Signed)
° °Breast Pumping Tips °If you are breastfeeding, there may be times when you cannot feed your baby directly. Returning to work or going on a trip are common examples. Pumping allows you to store breast milk and feed it to your baby later.  °You may not get much milk when you first start to pump. Your breasts should start to make more after a few days. If you pump at the times you usually feed your baby, you may be able to keep making enough milk to feed your baby without also using formula. The more often you pump, the more milk you will produce.  °WHEN SHOULD I PUMP?  °· You can begin to pump soon after delivery. However, some experts recommend waiting about 4 weeks before giving your infant a bottle to make sure breastfeeding is going well.  °· If you plan to return to work, begin pumping a few weeks before. This will help you develop techniques that work best for you. It also lets you build up a supply of breast milk.   °· When you are with your infant, feed on demand and pump after each feeding.   °· When you are away from your infant for several hours, pump for about 15 minutes every 2-3 hours. Pump both breasts at the same time if you can.   °· If your infant has a formula feeding, make sure to pump around the same time.     °· If you drink any alcohol, wait 2 hours before pumping.   °HOW DO I PREPARE TO PUMP? °Your let-down reflex is the natural reaction to stimulation that makes your breast milk flow. It is easier to stimulate this reflex when you are relaxed. Find relaxation techniques that work for you. If you have difficulty with your let-down reflex, try these methods:  °· Smell one of your infant's blankets or an item of clothing.   °· Look at a picture or video of your infant.   °· Sit in a quiet, private space.   °· Massage the breast you plan to pump.   °· Place soothing warmth on the breast.   °· Play relaxing music.   °WHAT ARE SOME GENERAL BREAST PUMPING TIPS? °· Wash your hands before you pump.  You do not need to wash your nipples or breasts. °· There are three ways to pump. °¨ You can use your hand to massage and compress your breast. °¨ You can use a handheld manual pump. °¨ You can use an electric pump.   °· Make sure the suction cup (flange) on the breast pump is the right size. Place the flange directly over the nipple. If it is the wrong size or placed the wrong way, it may be painful and cause nipple damage.   °· If pumping is uncomfortable, apply a small amount of purified or modified lanolin to your nipple and areola. °· If you are using an electric pump, adjust the speed and suction power to be more comfortable. °· If pumping is painful or if you are not getting very much milk, you may need a different type of pump. A lactation consultant can help you determine what type of pump to use.   °· Keep a full water bottle near you at all times. Drinking lots of fluid helps you make more milk.  °· You can store your milk to use later. Pumped breast milk can be stored in a sealable, sterile container or plastic bag. Label all stored breast milk with the date you pumped it. °¨ Milk can stay out at room temperature for up to 8 hours. °¨   You can store your milk in the refrigerator for up to 8 days. °¨ You can store your milk in the freezer for 3 months. Thaw frozen milk using warm water. Do not put it in the microwave. °· Do not smoke. Smoking can lower your milk supply and harm your infant. If you need help quitting, ask your health care provider to recommend a program.   °WHEN SHOULD I CALL MY HEALTH CARE PROVIDER OR A LACTATION CONSULTANT? °· You are having trouble pumping. °· You are concerned that you are not making enough milk. °· You have nipple pain, soreness, or redness. °· You want to use birth control. Birth control pills may lower your milk supply. Talk to your health care provider about your options. °Document Released: 02/04/2010 Document Revised: 08/22/2013 Document Reviewed:  06/09/2013 °Exit[employer]® Patient Information ©2015 [employer]. This information is not intended to replace advice given to you by your health care provider. Make sure you discuss any questions you have with your health care provider. ° °

## 2015-02-07 LAB — CBC
HEMATOCRIT: 31.4 % — AB (ref 35.0–47.0)
Hemoglobin: 10.5 g/dL — ABNORMAL LOW (ref 12.0–16.0)
MCH: 32.4 pg (ref 26.0–34.0)
MCHC: 33.4 g/dL (ref 32.0–36.0)
MCV: 96.8 fL (ref 80.0–100.0)
Platelets: 225 10*3/uL (ref 150–440)
RBC: 3.24 MIL/uL — ABNORMAL LOW (ref 3.80–5.20)
RDW: 12.7 % (ref 11.5–14.5)
WBC: 12.2 10*3/uL — AB (ref 3.6–11.0)

## 2015-02-07 LAB — RPR: RPR Ser Ql: NONREACTIVE

## 2015-02-07 NOTE — Progress Notes (Signed)
  Post Partum Day 1 Subjective: no complaints and up ad lib  Objective: Blood pressure 101/56, pulse 77, temperature 97.9 F (36.6 C), temperature source Oral, resp. rate 12, last menstrual period 05/09/2014, SpO2 98 %, bottle feeding   Physical Exam:  General: alert Lochia: appropriate Uterine Fundus: firm Incision: N/A DVT Evaluation: No evidence of DVT seen on physical exam. Abdomen: soft, NT   Recent Labs  02/06/15 0801 02/07/15 0619  HGB 12.2 10.5*  HCT 35.8 31.4*    Assessment PPD #1  Plan: Plan for discharge tomorrow  Feeding: bottle Contraception: condoms R Immune/V Immune TDAP Immune   Marta Antu, CNM 02/07/2015, 9:14 AM

## 2015-02-08 DIAGNOSIS — Z98891 History of uterine scar from previous surgery: Secondary | ICD-10-CM

## 2015-02-08 NOTE — Discharge Summary (Signed)
Obstetrical Discharge Summary  Date of Admission: 02/06/2015 Date of Discharge: 02/08/2015  Primary OB: [facility]  Gestational Age at Delivery: [redacted]w[redacted]d   Antepartum complications: obesity Reason for Admission: labor, spontaneous rupture of membranes, trial of labor after cesarean (TOLAC) Date of Delivery: 02/06/2015  Delivered By: Leeroy Bock Ward,MD Delivery Type: vaginal birth after cesarean (VBAC) and vacuum, outlet Intrapartum complications/course: None Anesthesia: local Placenta: spontaneous Laceration: 2nd degree and labial  Episiotomy: none Baby: Liveborn female, APGARs8/9, weight 3260 g. (7lb, 3oz)  Post partum course: Uncomplicated. Pt stable for discharge on PPD 2.  Disposition: Home  Rh Immune globulin given:no Rubella vaccine given: no Tdap vaccine given in AP or PP setting: antepartum Flu vaccine given in AP or PP setting: no  Contraception: condoms  Prenatal Labs:  A+ RI VI GBS pos HepB neg HIV neg RPR NR    Plan:  Joanna Page was discharged to home in good condition. Follow-up appointment at [facility] in 6 weeks with Dr. Elesa Massed.  No future appointments.  Discharge Medications:   Medication List    TAKE these medications        prenatal multivitamin Tabs tablet  Take 1 tablet by mouth daily.

## 2015-02-08 NOTE — Progress Notes (Signed)
pt discharged home with infant.  Discharge instructions, prescriptions and follow up appointment given to and reviewed with pt.  Pt verbalized understanding, all questions answered.  Escorted by auxiliary. 

## 2015-02-11 ENCOUNTER — Other Ambulatory Visit: Payer: Self-pay

## 2015-02-18 ENCOUNTER — Other Ambulatory Visit: Payer: Self-pay

## 2015-02-19 ENCOUNTER — Encounter: Admission: RE | Payer: Self-pay | Source: Ambulatory Visit

## 2015-02-19 ENCOUNTER — Inpatient Hospital Stay: Admission: RE | Admit: 2015-02-19 | Payer: 59 | Source: Ambulatory Visit | Admitting: Obstetrics and Gynecology

## 2015-02-19 SURGERY — Surgical Case
Anesthesia: Spinal

## 2018-08-29 ENCOUNTER — Ambulatory Visit (INDEPENDENT_AMBULATORY_CARE_PROVIDER_SITE_OTHER): Payer: 59 | Admitting: Certified Nurse Midwife

## 2018-08-29 ENCOUNTER — Encounter: Payer: Self-pay | Admitting: Certified Nurse Midwife

## 2018-08-29 VITALS — BP 126/80 | HR 97 | Wt 182.5 lb

## 2018-08-29 DIAGNOSIS — Z3A15 15 weeks gestation of pregnancy: Secondary | ICD-10-CM

## 2018-08-29 DIAGNOSIS — Z3201 Encounter for pregnancy test, result positive: Secondary | ICD-10-CM

## 2018-08-29 DIAGNOSIS — N926 Irregular menstruation, unspecified: Secondary | ICD-10-CM

## 2018-08-29 DIAGNOSIS — Z98891 History of uterine scar from previous surgery: Secondary | ICD-10-CM | POA: Insufficient documentation

## 2018-08-29 DIAGNOSIS — Z3480 Encounter for supervision of other normal pregnancy, unspecified trimester: Secondary | ICD-10-CM

## 2018-08-29 DIAGNOSIS — O34219 Maternal care for unspecified type scar from previous cesarean delivery: Secondary | ICD-10-CM

## 2018-08-29 DIAGNOSIS — Z3492 Encounter for supervision of normal pregnancy, unspecified, second trimester: Secondary | ICD-10-CM

## 2018-08-29 LAB — POCT URINE PREGNANCY: Preg Test, Ur: POSITIVE — AB

## 2018-08-29 NOTE — Progress Notes (Signed)
NEW OB HISTORY AND PHYSICAL  SUBJECTIVE:       Joanna Page is a 32 y.o. [phone] female, Patient's last menstrual period was 05/15/2018., Estimated Date of Delivery: 02/19/19, 3172w1d, presents today for establishment of Prenatal Care.  She has no unusual complaints. Endorses first trimester nausea and vomiting that has since resolved.   Denies difficulty breathing or respiratory distress, chest pain, abdominal pain, vaginal bleeding, dysuria, and leg pain or swelling.   Declines genetic screening. Desires trial of labor after cesarean with history of successful vaginal birth after cesarean. Prefers midwifery care.   Gynecologic History  Patient's last menstrual period was 05/15/2018.   Contraception: none  Last Pap: 2016. Results were: normal  Obstetric History  OB History  Gravida Para Term Preterm AB Living  4 1 1   1 2   SAB TAB Ectopic Multiple Live Births      1 0 2    # Outcome Date GA Lbr Len/2nd Weight Sex Delivery Anes PTL Lv  4 Current           3 Term 02/06/15 7534w0d 02:24 / 00:13 7 lb 3 oz (3.26 kg) M VBAC, Vacuum None  LIV  2 Gravida 06/04/13    M CS-LTranv   LIV     Complications: Fetal Intolerance  1 Ectopic             Past Medical History:  Diagnosis Date  . No pertinent past medical history     Past Surgical History:  Procedure Laterality Date  . WISDOM TOOTH EXTRACTION      Current Outpatient Medications on File Prior to Visit  Medication Sig Dispense Refill  . Prenatal Vit-Fe Fumarate-FA (PRENATAL MULTIVITAMIN) TABS tablet Take 1 tablet by mouth daily.      No current facility-administered medications on file prior to visit.     No Known Allergies  Social History   Socioeconomic History  . Marital status: Married    Spouse name: Not on file  . Number of children: Not on file  . Years of education: Not on file  . Highest education level: Not on file  Occupational History  . Not on file  Social Needs  . Financial resource strain: Not on  file  . Food insecurity:    Worry: Not on file    Inability: Not on file  . Transportation needs:    Medical: Not on file    Non-medical: Not on file  Tobacco Use  . Smoking status: Never Smoker  . Smokeless tobacco: Never Used  Substance and Sexual Activity  . Alcohol use: Yes    Comment: occasional  . Drug use: No  . Sexual activity: Yes    Birth control/protection: None  Lifestyle  . Physical activity:    Days per week: Not on file    Minutes per session: Not on file  . Stress: Not on file  Relationships  . Social connections:    Talks on phone: Not on file    Gets together: Not on file    Attends religious service: Not on file    Active member of club or organization: Not on file    Attends meetings of clubs or organizations: Not on file    Relationship status: Not on file  . Intimate partner violence:    Fear of current or ex partner: Not on file    Emotionally abused: Not on file    Physically abused: Not on file    Forced sexual activity: Not  on file  Other Topics Concern  . Not on file  Social History Narrative  . Not on file    Family History  Problem Relation Age of Onset  . Other Neg Hx     The following portions of the patient's history were reviewed and updated as appropriate: allergies, current medications, past OB history, past medical history, past surgical history, past family history, past social history, and problem list.  OBJECTIVE:  BP 126/80   Pulse 97   Wt 182 lb 8 oz (82.8 kg)   LMP 05/15/2018   BMI 26.19 kg/m   Initial Physical Exam (New OB)  GENERAL APPEARANCE: alert, well appearing, in no apparent distress  HEAD: normocephalic, atraumatic  MOUTH: mucous membranes moist, pharynx normal without lesions  THYROID: no thyromegaly or masses present  BREASTS: not indicated  LUNGS: clear to auscultation, no wheezes, rales or rhonchi, symmetric air entry  HEART: regular rate and rhythm, no murmurs  ABDOMEN: soft, nontender,  nondistended, no abnormal masses, no epigastric pain, fundus soft, nontender 16 weeks size and FHT present  EXTREMITIES: no redness or tenderness in the calves or thighs, no edema  SKIN: normal coloration and turgor, no rashes  LYMPH NODES: no adenopathy palpable  NEUROLOGIC: alert, oriented, normal speech, no focal findings or movement disorder noted  PELVIC EXAM: not indicated  ASSESSMENT: Normal pregnancy Desires TOLAC, history successful VBAC Declines genetic screening Desires midwifery care  PLAN: Prenatal care New OB counseling: The patient has been given an overview regarding routine prenatal care. Recommendations regarding diet, weight gain, and exercise in pregnancy were given. Prenatal testing, optional genetic testing, and ultrasound use in pregnancy were reviewed.  Benefits of Breast Feeding were discussed. The patient is encouraged to consider nursing her baby post partum. See orders   Gunnar BullaJenkins Michelle Nunzio Banet, CNM [facility], [facility] 08/29/18 10:26 AM

## 2018-08-29 NOTE — Patient Instructions (Addendum)
Vaginal Birth After Cesarean Delivery  Vaginal birth after cesarean delivery (VBAC) is giving birth vaginally after previously delivering a baby through a cesarean section (C-section). A VBAC may be a safe option for you, depending on your health and other factors. It is important to discuss VBAC with your health care provider early in your pregnancy so you can understand the risks, benefits, and options. Having these discussions early will give you time to make your birth plan. Who are the best candidates for VBAC? The best candidates for VBAC are women who:  Have had one or two prior cesarean deliveries, and the incision made during the delivery was horizontal (low transverse).  Do not have a vertical (classical) scar on their uterus.  Have not had a tear in the wall of their uterus (uterine rupture).  Plan to have more pregnancies. A VBAC is also more likely to be successful:  In women who have previously given birth vaginally.  When labor starts by itself (spontaneously) before the due date. What are the benefits of VBAC? The benefits of delivering your baby vaginally instead of by a cesarean delivery include:  A shorter hospital stay.  A faster recovery time.  Less pain.  Avoiding risks associated with major surgery, such as infection and blood clots.  Less blood loss and less need for donated blood (transfusions). What are the risks of VBAC? The main risk of attempting a VBAC is that it may fail, forcing your health care provider to deliver your baby by a C-section. Other risks are rare and include:  Tearing (rupture) of the scar from a past cesarean delivery.  Other risks associated with vaginal deliveries. If a repeat cesarean delivery is needed, the risks include:  Blood loss.  Infection.  Blood clot.  Damage to surrounding organs.  Removal of the uterus (hysterectomy), if it is damaged.  Placenta problems in future pregnancies. What else should I know  about my options? Delivering a baby through a VBAC is similar to having a normal spontaneous vaginal delivery. Therefore, it is safe:  To try with twins.  For your health care provider to try to turn the baby from a breech position (external cephalic version) during labor.  With epidural analgesia for pain relief. Consider where you would like to deliver your baby. VBAC should be attempted in facilities where an emergency cesarean delivery can be performed. VBAC is not recommended for home births. Any changes in your health or your baby's health during your pregnancy may make it necessary to change your initial decision about VBAC. Your health care provider may recommend that you do not attempt a VBAC if:  Your baby's suspected weight is 8.8 lb (4 kg) or more.  You have preeclampsia. This is a condition that causes high blood pressure along with other symptoms, such as swelling and headaches.  You will have VBAC less than 19 months after your cesarean delivery.  You are past your due date.  You need to have labor started (induced) because your cervix is not ready for labor (unfavorable). Where to find more information  American Pregnancy Association: americanpregnancy.org  [employer] Obst[employer]ians and Gynecologists: acog.org Summary  Vaginal birth after cesarean delivery (VBAC) is giving birth vaginally after previously delivering a baby through a cesarean section (C-section). A VBAC may be a safe option for you, depending on your health and other factors.  Discuss VBAC with your health care provider early in your pregnancy so you can understand the risks, benefits, options, and  have plenty of time to make your birth plan.  The main risk of attempting a VBAC is that it may fail, forcing your health care provider to deliver your baby by a C-section. Other risks are rare. This information is not intended to replace advice given to you by your health care provider. Make sure  you discuss any questions you have with your health care provider. Document Released: 02/07/2007 Document Revised: 11/24/2016 Document Reviewed: 11/24/2016 Elsevier Interactive Patient Education  2019 [employer].   Preparing for Vaginal Birth After Cesarean Delivery Vaginal birth after cesarean delivery (VBAC) is giving birth vaginally after previously delivering a baby through a cesarean section (C-section). You and your health are provider will discuss your options and whether you may be a good candidate for VBAC. What are my options? After a cesarean delivery, your options for future deliveries may include:  Scheduled repeat cesarean delivery. This is done in a hospital with an operating room.  Trial of labor after cesarean (TOLAC). A successful TOLAC results in a vaginal delivery. If it is not successful, you will need to have a cesarean delivery. TOLAC should be attempted in facilities where an emergency cesarean delivery can be performed. It should not be done as a home birth. Talk with your health care provider about the risks and benefits of each option early in your pregnancy. The best option for you will depend on your preferences and your overall health as well as your baby's. What should I know about my past cesarean delivery? It is important to know what type of incision was made in your uterus in a past cesarean delivery. The type of incision can affect the success of your TOLAC. Types of incisions include:  Low transverse. This is a side-to-side cut low on your uterus. The scar on your skin looks like a horizontal line just above your pubic area. This type of cut is the most common and makes you a good candidate for TOLAC.  Low vertical. This is an up-and-down cut low on your uterus. The scar on your skin looks like a vertical line between your pubic area and belly button. This type of cut puts you at higher risk for problems during TOLAC.  High vertical or classical. This is  an up-and-down cut high on your uterus. The scar on your skin looks like a vertical line that runs over the top of your belly button. This type of cut has the highest risk for problems and usually means that TOLAC is not an option. When is VBAC not an option? As you progress through your pregnancy, circumstances may change and you may need to reconsider your options. Your situation may also change even as you begin TOLAC. Your health care provider may not want you to attempt a VBAC if you:  Need to have labor started (induced) because your cervix is not ready for labor.  Have never had a vaginal delivery.  Have had more than two cesarean deliveries.  Are overdue.  Are pregnant with a very large baby.  Have a condition that causes high blood pressure (preeclampsia). Questions to ask your health care provider  Am I a good candidate for TOLAC?  What are my chances of a successful vaginal delivery?  Is my preferred birth location equipped for a TOLAC?  What are my pain management options during a TOLAC? Where to find more information  American Congress of Obstetricians and Gynecologists: www.acog.[place]: www.midwife.org Summary  Vaginal birth after  cesarean delivery (VBAC) is giving birth vaginally after previously delivering a baby through a cesarean section (C-section).  VBAC may be a safe and appropriate option for you depending on your medical history and other risk factors. Talk with your health care provider about the options available to you, and the risks and benefits of each early in your pregnancy.  TOLAC should be attempted in facilities where emergency cesarean section procedures can be performed. This information is not intended to replace advice given to you by your health care provider. Make sure you discuss any questions you have with your health care provider. Document Released: 05/05/2011 Document Revised: 11/26/2016 Document  Reviewed: 11/26/2016 Elsevier Interactive Patient Education  2019 [employer] Inc[employer]  WHAT OB PATIENTS CAN EXPECT   Confirmation of pregnancy and ultrasound ordered if medically indicated-[redacted] weeks gestation  New OB (NOB) intake with nurse and New OB (NOB) labs- [redacted] weeks gestation  New OB (NOB) physical examination with provider- 11/[redacted] weeks gestation  Flu vaccine-[redacted] weeks gestation  Anatomy scan-[redacted] weeks gestation  Glucose tolerance test, blood work to test for anemia, T-dap vaccine-[redacted] weeks gestation  Vaginal swabs/cultures-STD/Group B strep-[redacted] weeks gestation  Appointments every 4 weeks until 28 weeks  Every 2 weeks from 28 weeks until 36 weeks  Weekly visits from 36 weeks until delivery   Back Pain in Pregnancy Back pain during pregnancy is common. Back pain may be caused by several factors that are related to changes during your pregnancy. Follow these instructions at home: Managing pain, stiffness, and swelling      If directed, for sudden (acute) back pain, put ice on the painful area. ? Put ice in a plastic bag. ? Place a towel between your skin and the bag. ? Leave the ice on for 20 minutes, 2-3 times per day.  If directed, apply heat to the affected area before you exercise. Use the heat source that your health care provider recommends, such as a moist heat pack or a heating pad. ? Place a towel between your skin and the heat source. ? Leave the heat on for 20-30 minutes. ? Remove the heat if your skin turns bright red. This is especially important if you are unable to feel pain, heat, or cold. You may have a greater risk of getting burned.  If directed, massage the affected area. Activity  Exercise as told by your health care provider. Gentle exercise is the best way to prevent or manage back pain.  Listen to your body when lifting. If lifting hurts, ask for help or bend your knees. This uses your leg muscles instead of your back muscles.  Squat down when  picking up something from the floor. Do not bend over.  Only use bed rest for short periods as told by your health care provider. Bed rest should only be used for the most severe episodes of back pain. Standing, sitting, and lying down  Do not stand in one place for long periods of time.  Use good posture when sitting. Make sure your head rests over your shoulders and is not hanging forward. Use a pillow on your lower back if necessary.  Try sleeping on your side, preferably the left side, with a pregnancy support pillow or 1-2 regular pillows between your legs. ? If you have back pain after a night's rest, your bed may be too soft. ? A firm mattress may provide more support for your back during pregnancy. General instructions  Do not wear high heels.  Eat a  healthy diet. Try to gain weight within your health care provider's recommendations.  Use a maternity girdle, elastic sling, or back brace as told by your health care provider.  Take over-the-counter and prescription medicines only as told by your health care provider.  Work with a physical therapist or massage therapist to find ways to manage back pain. Acupuncture or massage therapy may be helpful.  Keep all follow-up visits as told by your health care provider. This is important. Contact a health care provider if:  Your back pain interferes with your daily activities.  You have increasing pain in other parts of your body. Get help right away if:  You develop numbness, tingling, weakness, or problems with the use of your arms or legs.  You develop severe back pain that is not controlled with medicine.  You have a change in bowel or bladder control.  You develop shortness of breath, dizziness, or you faint.  You develop nausea, vomiting, or sweating.  You have back pain that is a rhythmic, cramping pain similar to labor pains. Labor pain is usually 1-2 minutes apart, lasts for about 1 minute, and involves a bearing down  feeling or pressure in your pelvis.  You have back pain and your water breaks or you have vaginal bleeding.  You have back pain or numbness that travels down your leg.  Your back pain developed after you fell.  You develop pain on one side of your back.  You see blood in your urine.  You develop skin blisters in the area of your back pain. Summary  Back pain may be caused by several factors that are related to changes during your pregnancy.  Follow instructions as told by your health care provider for managing pain, stiffness, and swelling.  Exercise as told by your health care provider. Gentle exercise is the best way to prevent or manage back pain.  Take over-the-counter and prescription medicines only as told by your health care provider.  Keep all follow-up visits as told by your health care provider. This is important. This information is not intended to replace advice given to you by your health care provider. Make sure you discuss any questions you have with your health care provider. Document Released: 11/25/2005 Document Revised: 02/02/2018 Document Reviewed: 02/02/2018 Elsevier Interactive Patient Education  2019 [place].  Round Ligament Pain  The round ligament is a cord of muscle and tissue that helps support the uterus. It can become a source of pain during pregnancy if it becomes stretched or twisted as the baby grows. The pain usually begins in the second trimester (13-28 weeks) of pregnancy, and it can come and go until the baby is delivered. It is not a serious problem, and it does not cause harm to the baby. Round ligament pain is usually a short, sharp, and pinching pain, but it can also be a dull, lingering, and aching pain. The pain is felt in the lower side of the abdomen or in the groin. It usually starts deep in the groin and moves up to the outside of the hip area. The pain may occur when you:  Suddenly change position, such as quickly going from a  sitting to standing position.  Roll over in bed.  Cough or sneeze.  Do physical activity. Follow these instructions at home:   Watch your condition for any changes.  When the pain starts, relax. Then try any of these methods to help with the pain: ? Sitting down. ? Flexing your  knees up to your abdomen. ? Lying on your side with one pillow under your abdomen and another pillow between your legs. ? Sitting in a warm bath for 15-20 minutes or until the pain goes away.  Take over-the-counter and prescription medicines only as told by your health care provider.  Move slowly when you sit down or stand up.  Avoid long walks if they cause pain.  Stop or reduce your physical activities if they cause pain.  Keep all follow-up visits as told by your health care provider. This is important. Contact a health care provider if:  Your pain does not go away with treatment.  You feel pain in your back that you did not have before.  Your medicine is not helping. Get help right away if:  You have a fever or chills.  You develop uterine contractions.  You have vaginal bleeding.  You have nausea or vomiting.  You have diarrhea.  You have pain when you urinate. Summary  Round ligament pain is felt in the lower abdomen or groin. It is usually a short, sharp, and pinching pain. It can also be a dull, lingering, and aching pain.  This pain usually begins in the second trimester (13-28 weeks). It occurs because the uterus is stretching with the growing baby, and it is not harmful to the baby.  You may notice the pain when you suddenly change position, when you cough or sneeze, or during physical activity.  Relaxing, flexing your knees to your abdomen, lying on one side, or taking a warm bath may help to get rid of the pain.  Get help from your health care provider if the pain does not go away or if you have vaginal bleeding, nausea, vomiting, diarrhea, or painful urination. This  information is not intended to replace advice given to you by your health care provider. Make sure you discuss any questions you have with your health care provider. Document Released: 05/26/2008 Document Revised: 02/02/2018 Document Reviewed: 02/02/2018 Elsevier Interactive Patient Education  2019 [employer].  Common Medications Safe in Pregnancy  Acne:      Constipation:  Benzoyl Peroxide     Colace  Clindamycin      Dulcolax Suppository  Topica Erythromycin     Fibercon  Salicylic Acid      Metamucil         Miralax AVOID:        Senakot   Accutane    Cough:  Retin-A       Cough Drops  Tetracycline      Phenergan w/ Codeine if Rx  Minocycline      Robitussin (Plain & DM)  Antibiotics:     Crabs/Lice:  Ceclor       RID  Cephalosporins    AVOID:  E-Mycins      Kwell  Keflex  Macrobid/Macrodantin   Diarrhea:  Penicillin      Kao-Pectate  Zithromax      Imodium AD         PUSH FLUIDS AVOID:       Cipro     Fever:  Tetracycline      Tylenol (Regular or Extra  Minocycline       Strength)  Levaquin      Extra Strength-Do not          Exceed 8 tabs/24 hrs Caffeine:        '200mg'$ /day (equiv. To 1 cup of coffee or  approx. 3 12 oz sodas)  Gas: Cold/Hayfever:       Gas-X  Benadryl      Mylicon  Claritin       Phazyme  **Claritin-D        Chlor-Trimeton    Headaches:  Dimetapp      ASA-Free Excedrin  Drixoral-Non-Drowsy     Cold Compress  Mucinex (Guaifenasin)     Tylenol (Regular or Extra  Sudafed/Sudafed-12 Hour     Strength)  **Sudafed PE Pseudoephedrine   Tylenol Cold & Sinus     Vicks Vapor Rub  Zyrtec  **AVOID if Problems With Blood Pressure         Heartburn: Avoid lying down for at least 1 hour after meals  Aciphex      Maalox     Rash:  Milk of Magnesia     Benadryl    Mylanta       1% Hydrocortisone Cream  Pepcid  Pepcid Complete   Sleep Aids:  Prevacid      Ambien   Prilosec       Benadryl  Rolaids       Chamomile Tea  Tums (Limit  4/day)     Unisom         Tylenol PM         Warm milk-add vanilla or  Hemorrhoids:       Sugar for taste  Anusol/Anusol H.C.  (RX: Analapram 2.5%)  Sugar Substitutes:  Hydrocortisone OTC     Ok in moderation  Preparation H      Tucks        Vaseline lotion applied to tissue with wiping    Herpes:     Throat:  Acyclovir      Oragel  Famvir  Valtrex     Vaccines:         Flu Shot Leg Cramps:       *Gardasil  Benadryl      Hepatitis A         Hepatitis B Nasal Spray:       Pneumovax  Saline Nasal Spray     Polio Booster         Tetanus Nausea:       Tuberculosis test or PPD  Vitamin B6 25 mg TID   AVOID:    Dramamine      *Gardasil  Emetrol       Live Poliovirus  Ginger Root 250 mg QID    MMR (measles, mumps &  High Complex Carbs @ Bedtime    rebella)  Sea Bands-Accupressure    Varicella (Chickenpox)  Unisom 1/2 tab TID     *No known complications           If received before Pain:         Known pregnancy;   Darvocet       Resume series after  Lortab        Delivery  Percocet    Yeast:   Tramadol      Femstat  Tylenol 3      Gyne-lotrimin  Ultram       Monistat  Vicodin           MISC:         All Sunscreens           Hair Coloring/highlights          Insect Repellant's          (Including DEET)         Mystic Tans  Second Trimester of Pregnancy  The second trimester is from week 14 through week 27 (month 4 through 6). This is often the time in pregnancy that you feel your best. Often times, morning sickness has lessened or quit. You may have more energy, and you may get hungry more often. Your unborn baby is growing rapidly. At the end of the sixth month, he or she is about 9 inches long and weighs about 1 pounds. You will likely feel the baby move between 18 and 20 weeks of pregnancy. Follow these instructions at home: Medicines  Take over-the-counter and prescription medicines only as told by your doctor. Some medicines are safe and some medicines are not safe  during pregnancy.  Take a prenatal vitamin that contains at least 600 micrograms (mcg) of folic acid.  If you have trouble pooping (constipation), take medicine that will make your stool soft (stool softener) if your doctor approves. Eating and drinking   Eat regular, healthy meals.  Avoid raw meat and uncooked cheese.  If you get low calcium from the food you eat, talk to your doctor about taking a daily calcium supplement.  Avoid foods that are high in fat and sugars, such as fried and sweet foods.  If you feel sick to your stomach (nauseous) or throw up (vomit): ? Eat 4 or 5 small meals a day instead of 3 large meals. ? Try eating a few soda crackers. ? Drink liquids between meals instead of during meals.  To prevent constipation: ? Eat foods that are high in fiber, like fresh fruits and vegetables, whole grains, and beans. ? Drink enough fluids to keep your pee (urine) clear or pale yellow. Activity  Exercise only as told by your doctor. Stop exercising if you start to have cramps.  Do not exercise if it is too hot, too humid, or if you are in a place of great height (high altitude).  Avoid heavy lifting.  Wear low-heeled shoes. Sit and stand up straight.  You can continue to have sex unless your doctor tells you not to. Relieving pain and discomfort  Wear a good support bra if your breasts are tender.  Take warm water baths (sitz baths) to soothe pain or discomfort caused by hemorrhoids. Use hemorrhoid cream if your doctor approves.  Rest with your legs raised if you have leg cramps or low back pain.  If you develop puffy, bulging veins (varicose veins) in your legs: ? Wear support hose or compression stockings as told by your doctor. ? Raise (elevate) your feet for 15 minutes, 3-4 times a day. ? Limit salt in your food. Prenatal care  Write down your questions. Take them to your prenatal visits.  Keep all your prenatal visits as told by your doctor. This is  important. Safety  Wear your seat belt when driving.  Make a list of emergency phone numbers, including numbers for family, friends, the hospital, and police and fire departments. General instructions  Ask your doctor about the right foods to eat or for help finding a counselor, if you need these services.  Ask your doctor about local prenatal classes. Begin classes before month 6 of your pregnancy.  Do not use hot tubs, steam rooms, or saunas.  Do not douche or use tampons or scented sanitary pads.  Do not cross your legs for long periods of time.  Visit your dentist if you have not done so. Use a soft toothbrush to brush your teeth. Floss gently.  Avoid all smoking,  herbs, and alcohol. Avoid drugs that are not approved by your doctor.  Do not use any products that contain nicotine or tobacco, such as cigarettes and e-cigarettes. If you need help quitting, ask your doctor.  Avoid cat litter boxes and soil used by cats. These carry germs that can cause birth defects in the baby and can cause a loss of your baby (miscarriage) or stillbirth. Contact a doctor if:  You have mild cramps or pressure in your lower belly.  You have pain when you pee (urinate).  You have bad smelling fluid coming from your vagina.  You continue to feel sick to your stomach (nauseous), throw up (vomit), or have watery poop (diarrhea).  You have a nagging pain in your belly area.  You feel dizzy. Get help right away if:  You have a fever.  You are leaking fluid from your vagina.  You have spotting or bleeding from your vagina.  You have severe belly cramping or pain.  You lose or gain weight rapidly.  You have trouble catching your breath and have chest pain.  You notice sudden or extreme puffiness (swelling) of your face, hands, ankles, feet, or legs.  You have not felt the baby move in over an hour.  You have severe headaches that do not go away when you take medicine.  You have  trouble seeing. Summary  The second trimester is from week 14 through week 27 (months 4 through 6). This is often the time in pregnancy that you feel your best.  To take care of yourself and your unborn baby, you will need to eat healthy meals, take medicines only if your doctor tells you to do so, and do activities that are safe for you and your baby.  Call your doctor if you get sick or if you notice anything unusual about your pregnancy. Also, call your doctor if you need help with the right food to eat, or if you want to know what activities are safe for you. This information is not intended to replace advice given to you by your health care provider. Make sure you discuss any questions you have with your health care provider. Document Released: 11/11/2009 Document Revised: 09/22/2016 Document Reviewed: 09/22/2016 Elsevier Interactive Patient Education  2019 [employer].

## 2018-08-29 NOTE — Progress Notes (Signed)
Pt came in for confirmation, LMP 05/15/18, EDD 02/19/19, she is doing well

## 2018-08-30 LAB — CBC WITH DIFFERENTIAL/PLATELET
Basophils Absolute: 0 10*3/uL (ref 0.0–0.2)
Basos: 0 %
EOS (ABSOLUTE): 0 10*3/uL (ref 0.0–0.4)
Eos: 0 %
Hematocrit: 35 % (ref 34.0–46.6)
Hemoglobin: 12.1 g/dL (ref 11.1–15.9)
IMMATURE GRANULOCYTES: 0 %
Immature Grans (Abs): 0 10*3/uL (ref 0.0–0.1)
Lymphocytes Absolute: 1.5 10*3/uL (ref 0.7–3.1)
Lymphs: 21 %
MCH: 32.8 pg (ref 26.6–33.0)
MCHC: 34.6 g/dL (ref 31.5–35.7)
MCV: 95 fL (ref 79–97)
Monocytes Absolute: 0.4 10*3/uL (ref 0.1–0.9)
Monocytes: 5 %
NEUTROS PCT: 74 %
Neutrophils Absolute: 5.3 10*3/uL (ref 1.4–7.0)
Platelets: 240 10*3/uL (ref 150–450)
RBC: 3.69 x10E6/uL — AB (ref 3.77–5.28)
RDW: 11.6 % — ABNORMAL LOW (ref 12.3–15.4)
WBC: 7.3 10*3/uL (ref 3.4–10.8)

## 2018-08-30 LAB — HGB SOLU + RFLX FRAC: Sickle Solubility Test - HGBRFX: NEGATIVE

## 2018-08-30 LAB — MONITOR DRUG PROFILE 14(MW)
Amphetamine Scrn, Ur: NEGATIVE ng/mL
BARBITURATE SCREEN URINE: NEGATIVE ng/mL
BENZODIAZEPINE SCREEN, URINE: NEGATIVE ng/mL
Buprenorphine, Urine: NEGATIVE ng/mL
CANNABINOIDS UR QL SCN: NEGATIVE ng/mL
CREATININE(CRT), U: 81.9 mg/dL (ref 20.0–300.0)
Cocaine (Metab) Scrn, Ur: NEGATIVE ng/mL
Fentanyl, Urine: NEGATIVE pg/mL
Meperidine Screen, Urine: NEGATIVE ng/mL
Methadone Screen, Urine: NEGATIVE ng/mL
OPIATE SCREEN URINE: NEGATIVE ng/mL
OXYCODONE+OXYMORPHONE UR QL SCN: NEGATIVE ng/mL
PHENCYCLIDINE QUANTITATIVE URINE: NEGATIVE ng/mL
Ph of Urine: 7 (ref 4.5–8.9)
Propoxyphene Scrn, Ur: NEGATIVE ng/mL
SPECIFIC GRAVITY: 1.02
Tramadol Screen, Urine: NEGATIVE ng/mL

## 2018-08-30 LAB — URINALYSIS, ROUTINE W REFLEX MICROSCOPIC
Bilirubin, UA: NEGATIVE
Glucose, UA: NEGATIVE
Ketones, UA: NEGATIVE
LEUKOCYTES UA: NEGATIVE
Nitrite, UA: NEGATIVE
PH UA: 7 (ref 5.0–7.5)
Protein, UA: NEGATIVE
RBC UA: NEGATIVE
Specific Gravity, UA: 1.017 (ref 1.005–1.030)
Urobilinogen, Ur: 0.2 mg/dL (ref 0.2–1.0)

## 2018-08-30 LAB — GC/CHLAMYDIA PROBE AMP
Chlamydia trachomatis, NAA: NEGATIVE
Neisseria gonorrhoeae by PCR: NEGATIVE

## 2018-08-30 LAB — ABO AND RH: RH TYPE: POSITIVE

## 2018-08-30 LAB — ANTIBODY SCREEN: Antibody Screen: NEGATIVE

## 2018-08-30 LAB — HIV ANTIBODY (ROUTINE TESTING W REFLEX): HIV SCREEN 4TH GENERATION: NONREACTIVE

## 2018-08-30 LAB — HEPATITIS B SURFACE ANTIGEN: HEP B S AG: NEGATIVE

## 2018-08-30 LAB — RUBELLA SCREEN: Rubella Antibodies, IGG: 5.71 index (ref >0.99)

## 2018-08-30 LAB — RPR: RPR: NONREACTIVE

## 2018-08-30 LAB — VARICELLA ZOSTER ANTIBODY, IGG: Varicella zoster IgG: 844 index (ref >165)

## 2018-08-31 LAB — URINE CULTURE

## 2018-08-31 NOTE — L&D Delivery Note (Addendum)
Delivery Note   Joanna Page is a 33 y.o. [id number] at [redacted]w[redacted]d Estimated Date of Delivery: 02/19/19  PRE-OPERATIVE DIAGNOSIS:  1) [redacted]w[redacted]d pregnancy.   POST-OPERATIVE DIAGNOSIS:  1) [redacted]w[redacted]d pregnancy s/p VBAC, Spontaneous   Delivery Type: VBAC, Spontaneous    Delivery Anesthesia: None   Labor Complications:  precipitous delivery     ESTIMATED BLOOD LOSS: 400 ml    FINDINGS:   1) female infant, Apgar scores of 8   at 1 minute and 9   at 5 minutes and a birthweight of   ounces.    2) Nuchal cord: no   SPECIMENS:   PLACENTA:   Appearance: Intact , 3 vessel cord   Removal: Spontaneous      Disposition:  held per protocol then discarded  DISPOSITION:  Infant to left in stable condition in the delivery room, with L&D personnel and mother,  NARRATIVE SUMMARY: Labor course:  [name] is a [id number] at [redacted]w[redacted]d who presented for labor management.  She progressed well in labor without pitocin.  She received the appropriate anesthesia and proceeded to complete dilation. She evidenced good maternal expulsive effort during the second stage. She went on to deliver a viable female infant " [name]". The placenta delivered without problems and was noted to be complete. A perineal and vaginal examination was performed. Episiotomy/Lacerations: 1st degree , bilateral periurethral, 1 interrupted suture placed on right, Left side hemostatic not in need of repair. The lacerations were repaired with 3-0 Vicryl Ra[ode suture using local anesthesia. The patient tolerated this well. 800 mcg of Cytotec placed in the rectum and 1 dose of methergine given IM to help control bleeding. PT continued to have constant trickle, IV started, IV fentanyl given, bimanual exam completed for removal of large clot. Bleeding decreased after clot removed.   Philip Aspen, CNM  02/17/2019 3:45 AM

## 2018-09-26 ENCOUNTER — Other Ambulatory Visit: Payer: 59

## 2018-09-26 ENCOUNTER — Encounter: Payer: 59 | Admitting: Certified Nurse Midwife

## 2018-09-26 ENCOUNTER — Other Ambulatory Visit: Payer: Self-pay | Admitting: Certified Nurse Midwife

## 2018-09-26 DIAGNOSIS — Z3689 Encounter for other specified antenatal screening: Secondary | ICD-10-CM

## 2018-09-26 DIAGNOSIS — Z3492 Encounter for supervision of normal pregnancy, unspecified, second trimester: Secondary | ICD-10-CM

## 2018-09-27 ENCOUNTER — Ambulatory Visit (INDEPENDENT_AMBULATORY_CARE_PROVIDER_SITE_OTHER): Payer: BLUE CROSS/BLUE SHIELD | Admitting: Certified Nurse Midwife

## 2018-09-27 ENCOUNTER — Ambulatory Visit (INDEPENDENT_AMBULATORY_CARE_PROVIDER_SITE_OTHER): Payer: BLUE CROSS/BLUE SHIELD

## 2018-09-27 VITALS — BP 107/67 | HR 75 | Wt 186.4 lb

## 2018-09-27 DIAGNOSIS — Z3492 Encounter for supervision of normal pregnancy, unspecified, second trimester: Secondary | ICD-10-CM | POA: Diagnosis not present

## 2018-09-27 DIAGNOSIS — Z3689 Encounter for other specified antenatal screening: Secondary | ICD-10-CM | POA: Diagnosis not present

## 2018-09-27 LAB — POCT URINALYSIS DIPSTICK OB
Blood, UA: NEGATIVE
Leukocytes, UA: NEGATIVE
Nitrite, UA: NEGATIVE
PH UA: 6.5 (ref 5.0–8.0)
PROTEIN: NEGATIVE
Spec Grav, UA: 1.01 (ref 1.010–1.025)
Urobilinogen, UA: 0.2 E.U./dL

## 2018-09-27 NOTE — Patient Instructions (Signed)

## 2018-09-27 NOTE — Progress Notes (Signed)
ROB doing well. Feels good movement. Anatomy u/s today. Results reviewed. ( see below) Reviewed round ligament pain. Follow up 4 wks. With Melody.   Doreene Burke, CNM   Patient Name: Joanna Page DOB: Jan 04, 1986 MRN: [id number]  ULTRASOUND REPORT  Location: [facility] Date of Service: 09/27/2018   Indications:Anatomy Ultrasound  Findings:  Joanna Page intrauterine pregnancy is visualized with FHR at 152 BPM. Biometrics give an (U/S) Gestational age of [redacted]w[redacted]d and an (U/S) EDD of 02/15/19; this correlates with the clinically established Estimated Date of Delivery: 02/19/19  Fetal presentation is transverse. Fetal head on  maternal Lt EFW: wnl. Placenta: posterior. Grade: 0 AFI: subjectively normal.  Anatomic survey is complete and normal; Gender - female.    Right Ovary is normal in appearance. Left Ovary is normal appearance. Survey of the adnexa demonstrates no adnexal masses. There is no free peritoneal fluid in the cul de sac.  Impression: 1. [redacted]w[redacted]d Viable Singleton Intrauterine pregnancy by U/S. 2. (U/S) EDD is consistent with Clinically established Estimated Date of Delivery: 02/19/19 . 3. Normal Anatomy Scan  Recommendations: 1.Clinical correlation with the patient's History and Physical Exam.  Augustine Radar, RDMS

## 2018-10-27 ENCOUNTER — Ambulatory Visit (INDEPENDENT_AMBULATORY_CARE_PROVIDER_SITE_OTHER): Payer: BLUE CROSS/BLUE SHIELD | Admitting: Obstetrics and Gynecology

## 2018-10-27 VITALS — BP 117/58 | HR 72 | Wt 194.0 lb

## 2018-10-27 DIAGNOSIS — Z3492 Encounter for supervision of normal pregnancy, unspecified, second trimester: Secondary | ICD-10-CM

## 2018-10-27 LAB — POCT URINALYSIS DIPSTICK OB
BILIRUBIN UA: NEGATIVE
Blood, UA: NEGATIVE
Glucose, UA: NEGATIVE
KETONES UA: NEGATIVE
Leukocytes, UA: NEGATIVE
Nitrite, UA: NEGATIVE
POC,PROTEIN,UA: NEGATIVE
Spec Grav, UA: 1.01 (ref 1.010–1.025)
Urobilinogen, UA: 0.2 E.U./dL
pH, UA: 6 (ref 5.0–8.0)

## 2018-10-27 NOTE — Progress Notes (Signed)
ROB-doing well, glucola next visit. 

## 2018-10-27 NOTE — Progress Notes (Signed)
ROB- pt is doing well 

## 2018-11-23 NOTE — Progress Notes (Signed)
Coronavirus (COVID-19) Are you at risk?  Are you at risk for the Coronavirus (COVID-19)?  To be considered HIGH RISK for Coronavirus (COVID-19), you have to meet the following criteria:  . Traveled to [place], [place], [place], [place] or [place]; or in the [place] to [place], [place], [place], or [place]; and have fever, cough, and shortness of breath within the last 2 weeks of travel OR . Been in close contact with a person diagnosed with COVID-19 within the last 2 weeks and have fever, cough, and shortness of breath . IF YOU DO NOT MEET THESE CRITERIA, YOU ARE CONSIDERED LOW RISK FOR COVID-19.  What to do if you are HIGH RISK for COVID-19?  . If you are having a medical emergency, call 911. . Seek medical care right away. Before you go to a doctor's office, urgent care or emergency department, call ahead and tell them about your recent travel, contact with someone diagnosed with COVID-19, and your symptoms. You should receive instructions from your physician's office regarding next steps of care.  . When you arrive at healthcare provider, tell the healthcare staff immediately you have returned from visiting [place], [place], [place], [place] or [place]; or traveled in the [place] to [place], [place], [place], or [place]; in the last two weeks or you have been in close contact with a person diagnosed with COVID-19 in the last 2 weeks.   . Tell the health care staff about your symptoms: fever, cough and shortness of breath. . After you have been seen by a medical provider, you will be either: o Tested for (COVID-19) and discharged home on quarantine except to seek medical care if symptoms worsen, and asked to  - Stay home and avoid contact with others until you get your results (4-5 days)  - Avoid travel on public transportation if possible (such as bus, train, or airplane) or o Sent to the Emergency Department by EMS for evaluation, COVID-19 testing, and possible  admission depending on your condition and test results.  What to do if you are LOW RISK for COVID-19?  Reduce your risk of any infection by using the same precautions used for avoiding the common cold or flu:  . Wash your hands often with soap and warm water for at least 20 seconds.  If soap and water are not readily available, use an alcohol-based hand sanitizer with at least 60% alcohol.  . If coughing or sneezing, cover your mouth and nose by coughing or sneezing into the elbow areas of your shirt or coat, into a tissue or into your sleeve (not your hands). . Avoid shaking hands with others and consider head nods or verbal greetings only. . Avoid touching your eyes, nose, or mouth with unwashed hands.  . Avoid close contact with people who are sick. . Avoid places or events with large numbers of people in one location, like concerts or sporting events. . Carefully consider travel plans you have or are making. . If you are planning any travel outside or inside the [place], visit the CDC's Travelers' Health webpage for the latest health notices. . If you have some symptoms but not all symptoms, continue to monitor at home and seek medical attention if your symptoms worsen. . If you are having a medical emergency, call 911.  Spoke with pt she denies any sx.  Bentleigh Waren, CMA   ADDITIONAL HEALTHCARE OPTIONS FOR PATIENTS  [facility] / e-Visit: [email or web address]           [facility]: [phone]  [facility]: [phone]                   [facility]: [phone]  

## 2018-11-24 ENCOUNTER — Ambulatory Visit (INDEPENDENT_AMBULATORY_CARE_PROVIDER_SITE_OTHER): Payer: BLUE CROSS/BLUE SHIELD | Admitting: Obstetrics and Gynecology

## 2018-11-24 ENCOUNTER — Other Ambulatory Visit: Payer: BLUE CROSS/BLUE SHIELD

## 2018-11-24 ENCOUNTER — Other Ambulatory Visit: Payer: Self-pay

## 2018-11-24 VITALS — BP 109/60 | HR 77 | Wt 199.0 lb

## 2018-11-24 DIAGNOSIS — Z23 Encounter for immunization: Secondary | ICD-10-CM | POA: Diagnosis not present

## 2018-11-24 DIAGNOSIS — Z131 Encounter for screening for diabetes mellitus: Secondary | ICD-10-CM

## 2018-11-24 DIAGNOSIS — Z3492 Encounter for supervision of normal pregnancy, unspecified, second trimester: Secondary | ICD-10-CM

## 2018-11-24 DIAGNOSIS — Z130 Encounter for screening for diseases of the blood and blood-forming organs and certain disorders involving the immune mechanism: Secondary | ICD-10-CM

## 2018-11-24 LAB — POCT URINALYSIS DIPSTICK OB
BILIRUBIN UA: NEGATIVE
Blood, UA: NEGATIVE
Glucose, UA: NEGATIVE
Ketones, UA: NEGATIVE
LEUKOCYTES UA: NEGATIVE
Nitrite, UA: NEGATIVE
POC,PROTEIN,UA: NEGATIVE
Urobilinogen, UA: 0.2 E.U./dL
pH, UA: 7.5 (ref 5.0–8.0)

## 2018-11-24 MED ORDER — TETANUS-DIPHTH-ACELL PERTUSSIS 5-2.5-18.5 LF-MCG/0.5 IM SUSP
0.5000 mL | Freq: Once | INTRAMUSCULAR | Status: AC
  Administered 2018-11-24: 0.5 mL via INTRAMUSCULAR

## 2018-11-24 NOTE — Progress Notes (Signed)
ROB & Glucola- doing well, discussed covid-19 and current restrictions, recommendations.

## 2018-11-24 NOTE — Patient Instructions (Signed)
FREQUENTLY ASKED QUESTIONS FOR OBSTETRICS/PEDIATRICS    Q: Why are visitor restrictions different for maternity care areas?  [place] is restricting visitors for the duration of the patient's hospitalization. The birth of a child involves the mother, considered the patient, and a birthing partner. These are unprecedented times and we are making the exception to allow a birthing partner to be a part of the patient unit. No other guests will be allowed in our [place] at [facility] and at [facility].   Q: Are credentialed doulas allowed to support their existing patients?  We acknowledge the value these doula partnerships offer our care teams and many birthing families in our communities. Each laboring mother is allowed one birthing partner of the patient's choosing for her entire hospitalization.   Q: Are visitor restrictions different for hospitalized children?  Pediatric patients (infants and children under 50 years of age), such as those in the Children's Unit, Pediatric ICU and NICU, will be allowed two visitors (parents or legal guardians)   Q: Are pregnant women at an increased risk for COVID-19?  The [employer] of Obstetricians and Gynecologists (ACOG) is monitoring closely the coronavirus pandemic. With the limited information available, data does not indicate pregnant women are at an increased risk. However, pregnant women are known to be at greater risk for respiratory infections like flu. With that in mind, expectant mothers are considered an at-risk population for COVID-19, according to ACOG.   Q: Are newborns at an increased risk for COVID-19?  A limited sample of COVID-19 data with newborns indicates the virus is not transferred to the infant during pregnancy. However, postpartum separation is recommended by the Centers for  Disease Control (CDC). As a result [place] recommends and strongly encourages temporary separation of moms and babies who test positive for COVID-19 or are awaiting results to rule out COVID-19 based on CDC guidelines.   Q: If you have a suspected case of COVID-19, is the NICU couplet care room an option?  No. If either patient is considered at-risk for having COVID-19, the [place] at [facility] will not use the NICU couplet care rooms for that family.   Q: [place] is urging that elective procedures be postponed. What is considered elective for women's and children's service line?  NOT ELECTIVE: Obstetric procedures, even those with an element of choice on timing, are not considered elective. Circumcisions are considered elective procedures, however, these do not deplete blood products and other resources, which is the spirit in which the COVID-19 postponement of elective procedures was intended. Therefore, circumcisions will be allowed.   ELECTIVE: Postpartum tubal ligations are considered elective and should be postponed. Q&A for Obstetricians, Gynecologists and Pediatricians  Published November 18, 2018   [facility] supports as much as possible the medical care  care team working with the patient's individual needs to address timing during these unprecedented times. We seek the support of our medical care team in preserving needed resources throughout our crisis response to COVID-19.   Q: How does COVID-19 impact breastfeeding?  Breastmilk is safe for your baby - even if the mother has tested positive for COVID-19. If a COVID-19+ mother decides to breastfeed while inpatient and after discharge, we suggest proper protective equipment be worn and hand hygiene be performed before and after feeding the infant. The new mother also has the option to pump her milk and have a healthy family member feed the baby to protect the baby from getting the virus.   Q: Should we urge  patients to avoid baby showers and large gatherings?  Yes. As has been recommended for all citizens in our communities, gatherings of 10 or more should be avoided - pregnant or not. Seek creative options for "hosting" baby showers through electronic means that honor the request for social distancing during this time of heightened awareness.   Q: Should patients miss their prenatal appointments?  No. Prenatal visits are NOT elective. While we want to limit contact and exposure, prenatal care is vital right now. Contact your physician's office if you have concerns about your visits. We are limiting outpatient office visits to the patient and one guest in order to reduce the potential for exposure.   Q: What if a pregnant woman feels sick? Should she miss her prenatal visit then?  A pregnant woman experiencing coronavirus-like symptoms (i.e., cough, fever, difficulty breathing, shortness of breath, gastrointestinal issues) should contact her pregnancy care provider by phone. Her medical professional can best determine whether she should use a video visit or possibly go to a collection site to be tested for COVID-19. Contacting her primary care provider or her pregnancy care provider is her first step.   Q: What can I do about childbirth education? All the classes are cancelled.  The [facility] [place] will offer online learning to support mothers on their journey. We currently offer Understanding Childbirth, Understanding Breastfeeding and Understanding Newborn Care as an online class. Please visit our website, [email or web address], to register for an online class.   Q: How can I keep from getting COVID-19? Q&A for Obstetricians, Gynecologists and Pediatricians  Published November 18, 2018   Together, we can reduce the risk of exposure to the virus and help you and your family remain healthy and safe. One of the best ways to protect yourself is to wash your hands frequently using soap and  water. Also, you should avoid touching your eyes, nose and mouth with unwashed hands, avoid physical contact with others and practice social distancing.   Q: How are employees being informed about what to do?  [facility] leaders receive a daily COVID-19 update and share relevant information with their teams. This is a time when health care professionals are called on to lead within our community. We appreciate our staff's engagement with our COVID-19 updates and encourage them to share best practices on reducing the spread of the virus with our patients and community. We are prepared to provide the exceptional COVID-19 care and coordination our community needs, expects and deserves.   Q: Who's in charge of this issue at [facility]?  The leadership structure and process established to address COVID-19 includes Chief Physician Executive Phoebe Sharps, MD; Infection Prevention Medical Director Carlyle Basques, MD; and Infection Prevention Interim Director Hubert Azure, MSN, RN, CIC, CSPDT. A  of [place] experts reflecting a broad spectrum of our workforce is meeting daily to evaluate new information we receive about COVID-19 and to adapt policies and practices accordingly.  ° ° °                     Published November 18, 2018  ° ° ° °Third Trimester of Pregnancy °The third trimester is from week 28 through week 40 (months 7 through 9). The third trimester is a time when the unborn baby (fetus) is growing rapidly. At the end of the ninth month, the fetus is about 20 inches in length and weighs 6-10 pounds. °Body changes during your third trimester °Your body will continue to go through many changes during pregnancy. The changes vary from woman to woman. During the third trimester: °· Your weight will continue to increase. You can expect to gain 25-35 pounds (11-16 kg) by the end of the pregnancy. °· You may begin to get stretch marks on your hips, abdomen, and breasts. °· You may urinate more often because the  fetus is moving lower into your pelvis and pressing on your bladder. °· You may develop or continue to have heartburn. This is caused by increased hormones that slow down muscles in the digestive tract. °· You may develop or continue to have constipation because increased hormones slow digestion and cause the muscles that push waste through your intestines to relax. °· You may develop hemorrhoids. These are swollen veins (varicose veins) in the rectum that can itch or be painful. °· You may develop swollen, bulging veins (varicose veins) in your legs. °· You may have increased body aches in the pelvis, back, or thighs. This is due to weight gain and increased hormones that are relaxing your joints. °· You may have changes in your hair. These can include thickening of your hair, rapid growth, and changes in texture. Some women also have hair loss during or after pregnancy, or hair that feels dry or thin. Your hair will most likely return to normal after your baby is born. °· Your breasts will continue to grow and they will continue to become tender. A yellow fluid (colostrum) may leak from your breasts. This is the first milk you are producing for your baby. °· Your belly button may stick out. °· You may notice more swelling in your hands, face, or ankles. °· You may have increased tingling or numbness in your hands, arms, and legs. The skin on your belly may also feel numb. °· You may feel short of breath because of your expanding uterus. °· You may have more problems sleeping. This can be caused by the size of your belly, increased need to urinate, and an increase in your body's metabolism. °· You may notice the fetus "dropping," or moving lower in your abdomen (lightening). °· You may have increased vaginal discharge. °· You may notice your joints feel loose and you may have pain around your pelvic bone. °What to expect at prenatal visits °You will have prenatal exams every 2 weeks until week 36. Then you will  have weekly prenatal exams. During a routine prenatal visit: °· You will be weighed to make sure you and the baby are growing normally. °· Your blood pressure will be taken. °· Your abdomen will be measured to track your baby's growth. °· The fetal heartbeat will be listened to. °· Any test results from the previous visit will be discussed. °· You may have a cervical check near   your due date to see if your cervix has softened or thinned (effaced). °· You will be tested for Group B streptococcus. This happens between 35 and 37 weeks. °Your health care provider may ask you: °· What your birth plan is. °· How you are feeling. °· If you are feeling the baby move. °· If you have had any abnormal symptoms, such as leaking fluid, bleeding, severe headaches, or abdominal cramping. °· If you are using any tobacco products, including cigarettes, chewing tobacco, and electronic cigarettes. °· If you have any questions. °Other tests or screenings that may be performed during your third trimester include: °· Blood tests that check for low iron levels (anemia). °· Fetal testing to check the health, activity level, and growth of the fetus. Testing is done if you have certain medical conditions or if there are problems during the pregnancy. °· Nonstress test (NST). This test checks the health of your baby to make sure there are no signs of problems, such as the baby not getting enough oxygen. During this test, a belt is placed around your belly. The baby is made to move, and its heart rate is monitored during movement. °What is false labor? °False labor is a condition in which you feel small, irregular tightenings of the muscles in the womb (contractions) that usually go away with rest, changing position, or drinking water. These are called Braxton Hicks contractions. Contractions may last for hours, days, or even weeks before true labor sets in. If contractions come at regular intervals, become more frequent, increase in  intensity, or become painful, you should see your health care provider. °What are the signs of labor? °· Abdominal cramps. °· Regular contractions that start at 10 minutes apart and become stronger and more frequent with time. °· Contractions that start on the top of the uterus and spread down to the lower abdomen and back. °· Increased pelvic pressure and dull back pain. °· A watery or bloody mucus discharge that comes from the vagina. °· Leaking of amniotic fluid. This is also known as your "water breaking." It could be a slow trickle or a gush. Let your health care provider know if it has a color or strange odor. °If you have any of these signs, call your health care provider right away, even if it is before your due date. °Follow these instructions at home: °Medicines °· Follow your health care provider's instructions regarding medicine use. Specific medicines may be either safe or unsafe to take during pregnancy. °· Take a prenatal vitamin that contains at least 600 micrograms (mcg) of folic acid. °· If you develop constipation, try taking a stool softener if your health care provider approves. °Eating and drinking ° °· Eat a balanced diet that includes fresh fruits and vegetables, whole grains, good sources of protein such as meat, eggs, or tofu, and low-fat dairy. Your health care provider will help you determine the amount of weight gain that is right for you. °· Avoid raw meat and uncooked cheese. These carry germs that can cause birth defects in the baby. °· If you have low calcium intake from food, talk to your health care provider about whether you should take a daily calcium supplement. °· Eat four or five small meals rather than three large meals a day. °· Limit foods that are high in fat and processed sugars, such as fried and sweet foods. °· To prevent constipation: °? Drink enough fluid to keep your urine clear or pale yellow. °? Eat foods   that are high in fiber, such as fresh fruits and vegetables,  whole grains, and beans. °Activity °· Exercise only as directed by your health care provider. Most women can continue their usual exercise routine during pregnancy. Try to exercise for 30 minutes at least 5 days a week. Stop exercising if you experience uterine contractions. °· Avoid heavy lifting. °· Do not exercise in extreme heat or humidity, or at high altitudes. °· Wear low-heel, comfortable shoes. °· Practice good posture. °· You may continue to have sex unless your health care provider tells you otherwise. °Relieving pain and discomfort °· Take frequent breaks and rest with your legs elevated if you have leg cramps or low back pain. °· Take warm sitz baths to soothe any pain or discomfort caused by hemorrhoids. Use hemorrhoid cream if your health care provider approves. °· Wear a good support bra to prevent discomfort from breast tenderness. °· If you develop varicose veins: °? Wear support pantyhose or compression stockings as told by your healthcare provider. °? Elevate your feet for 15 minutes, 3-4 times a day. °Prenatal care °· Write down your questions. Take them to your prenatal visits. °· Keep all your prenatal visits as told by your health care provider. This is important. °Safety °· Wear your seat belt at all times when driving. °· Make a list of emergency phone numbers, including numbers for family, friends, the hospital, and police and fire departments. °General instructions °· Avoid cat litter boxes and soil used by cats. These carry germs that can cause birth defects in the baby. If you have a cat, ask someone to clean the litter box for you. °· Do not travel far distances unless it is absolutely necessary and only with the approval of your health care provider. °· Do not use hot tubs, steam rooms, or saunas. °· Do not drink alcohol. °· Do not use any products that contain nicotine or tobacco, such as cigarettes and e-cigarettes. If you need help quitting, ask your health care provider. °· Do not  use any medicinal herbs or unprescribed drugs. These chemicals affect the formation and growth of the baby. °· Do not douche or use tampons or scented sanitary pads. °· Do not cross your legs for long periods of time. °· To prepare for the arrival of your baby: °? Take prenatal classes to understand, practice, and ask questions about labor and delivery. °? Make a trial run to the hospital. °? Visit the hospital and tour the maternity area. °? Arrange for maternity or paternity leave through employers. °? Arrange for family and friends to take care of pets while you are in the hospital. °? Purchase a rear-facing car seat and make sure you know how to install it in your car. °? Pack your hospital bag. °? Prepare the baby’s nursery. Make sure to remove all pillows and stuffed animals from the baby's crib to prevent suffocation. °· Visit your dentist if you have not gone during your pregnancy. Use a soft toothbrush to brush your teeth and be gentle when you floss. °Contact a health care provider if: °· You are unsure if you are in labor or if your water has broken. °· You become dizzy. °· You have mild pelvic cramps, pelvic pressure, or nagging pain in your abdominal area. °· You have lower back pain. °· You have persistent nausea, vomiting, or diarrhea. °· You have an unusual or bad smelling vaginal discharge. °· You have pain when you urinate. °Get help right away if: °·   Your water breaks before 37 weeks. °· You have regular contractions less than 5 minutes apart before 37 weeks. °· You have a fever. °· You are leaking fluid from your vagina. °· You have spotting or bleeding from your vagina. °· You have severe abdominal pain or cramping. °· You have rapid weight loss or weight gain. °· You have shortness of breath with chest pain. °· You notice sudden or extreme swelling of your face, hands, ankles, feet, or legs. °· Your baby makes fewer than 10 movements in 2 hours. °· You have severe headaches that do not go away  when you take medicine. °· You have vision changes. °Summary °· The third trimester is from week 28 through week 40, months 7 through 9. The third trimester is a time when the unborn baby (fetus) is growing rapidly. °· During the third trimester, your discomfort may increase as you and your baby continue to gain weight. You may have abdominal, leg, and back pain, sleeping problems, and an increased need to urinate. °· During the third trimester your breasts will keep growing and they will continue to become tender. A yellow fluid (colostrum) may leak from your breasts. This is the first milk you are producing for your baby. °· False labor is a condition in which you feel small, irregular tightenings of the muscles in the womb (contractions) that eventually go away. These are called Braxton Hicks contractions. Contractions may last for hours, days, or even weeks before true labor sets in. °· Signs of labor can include: abdominal cramps; regular contractions that start at 10 minutes apart and become stronger and more frequent with time; watery or bloody mucus discharge that comes from the vagina; increased pelvic pressure and dull back pain; and leaking of amniotic fluid. °This information is not intended to replace advice given to you by your health care provider. Make sure you discuss any questions you have with your health care provider. °Document Released: 08/11/2001 Document Revised: 09/22/2016 Document Reviewed: 09/22/2016 °Elsevier Interactive Patient Education © 2019 [employer] ° °

## 2018-11-25 LAB — CBC
HEMATOCRIT: 31.9 % — AB (ref 34.0–46.6)
HEMOGLOBIN: 11 g/dL — AB (ref 11.1–15.9)
MCH: 33.2 pg — ABNORMAL HIGH (ref 26.6–33.0)
MCHC: 34.5 g/dL (ref 31.5–35.7)
MCV: 96 fL (ref 79–97)
PLATELETS: 240 10*3/uL (ref 150–450)
RBC: 3.31 x10E6/uL — AB (ref 3.77–5.28)
RDW: 11.4 % — AB (ref 11.7–15.4)
WBC: 8.1 10*3/uL (ref 3.4–10.8)

## 2018-11-25 LAB — GLUCOSE, 1 HOUR GESTATIONAL: GESTATIONAL DIABETES SCREEN: 106 mg/dL (ref 65–139)

## 2018-11-25 LAB — RPR: RPR Ser Ql: NONREACTIVE

## 2018-12-22 ENCOUNTER — Telehealth: Payer: Self-pay | Admitting: *Deleted

## 2018-12-22 NOTE — Telephone Encounter (Signed)
Coronavirus (COVID-19) Are you at risk?  Are you at risk for the Coronavirus (COVID-19)?  To be considered HIGH RISK for Coronavirus (COVID-19), you have to meet the following criteria:  . Traveled to [place], [place], [place], [place] or [place]; or in the [place] to [place], [place], [place], or [place]; and have fever, cough, and shortness of breath within the last 2 weeks of travel OR . Been in close contact with a person diagnosed with COVID-19 within the last 2 weeks and have fever, cough, and shortness of breath . IF YOU DO NOT MEET THESE CRITERIA, YOU ARE CONSIDERED LOW RISK FOR COVID-19.  What to do if you are HIGH RISK for COVID-19?  . If you are having a medical emergency, call 911. . Seek medical care right away. Before you go to a doctor's office, urgent care or emergency department, call ahead and tell them about your recent travel, contact with someone diagnosed with COVID-19, and your symptoms. You should receive instructions from your physician's office regarding next steps of care.  . When you arrive at healthcare provider, tell the healthcare staff immediately you have returned from visiting [place], [place], [place], [place] or [place]; or traveled in the [place] to [place], [place], [place], or [place]; in the last two weeks or you have been in close contact with a person diagnosed with COVID-19 in the last 2 weeks.   . Tell the health care staff about your symptoms: fever, cough and shortness of breath. . After you have been seen by a medical provider, you will be either: o Tested for (COVID-19) and discharged home on quarantine except to seek medical care if symptoms worsen, and asked to  - Stay home and avoid contact with others until you get your results (4-5 days)  - Avoid travel on public transportation if possible (such as bus, train, or airplane) or o Sent to the Emergency Department by EMS for evaluation, COVID-19 testing, and possible  admission depending on your condition and test results.  What to do if you are LOW RISK for COVID-19?  Reduce your risk of any infection by using the same precautions used for avoiding the common cold or flu:  . Wash your hands often with soap and warm water for at least 20 seconds.  If soap and water are not readily available, use an alcohol-based hand sanitizer with at least 60% alcohol.  . If coughing or sneezing, cover your mouth and nose by coughing or sneezing into the elbow areas of your shirt or coat, into a tissue or into your sleeve (not your hands). . Avoid shaking hands with others and consider head nods or verbal greetings only. . Avoid touching your eyes, nose, or mouth with unwashed hands.  . Avoid close contact with people who are sick. . Avoid places or events with large numbers of people in one location, like concerts or sporting events. . Carefully consider travel plans you have or are making. . If you are planning any travel outside or inside the [place], visit the CDC's Travelers' Health webpage for the latest health notices. . If you have some symptoms but not all symptoms, continue to monitor at home and seek medical attention if your symptoms worsen. . If you are having a medical emergency, call 911.   ADDITIONAL HEALTHCARE OPTIONS FOR PATIENTS  [facility] / e-Visit: [email or web address]         [facility]: [phone]  [facility]: [phone]                   [facility]: [phone]   Spoke with pt denies any sx.  Darleth Eustache, CMA 

## 2018-12-23 ENCOUNTER — Other Ambulatory Visit: Payer: Self-pay

## 2018-12-23 ENCOUNTER — Ambulatory Visit (INDEPENDENT_AMBULATORY_CARE_PROVIDER_SITE_OTHER): Payer: BLUE CROSS/BLUE SHIELD | Admitting: Certified Nurse Midwife

## 2018-12-23 VITALS — BP 116/56 | HR 91 | Wt 203.2 lb

## 2018-12-23 DIAGNOSIS — R319 Hematuria, unspecified: Secondary | ICD-10-CM

## 2018-12-23 DIAGNOSIS — O9989 Other specified diseases and conditions complicating pregnancy, childbirth and the puerperium: Secondary | ICD-10-CM

## 2018-12-23 DIAGNOSIS — Z3493 Encounter for supervision of normal pregnancy, unspecified, third trimester: Secondary | ICD-10-CM

## 2018-12-23 LAB — POCT URINALYSIS DIPSTICK OB
Bilirubin, UA: NEGATIVE
Glucose, UA: NEGATIVE
Ketones, UA: NEGATIVE
Leukocytes, UA: NEGATIVE
Nitrite, UA: NEGATIVE
POC,PROTEIN,UA: NEGATIVE
Spec Grav, UA: <=1.005 — AB (ref 1.010–1.025)
Urobilinogen, UA: 0.2 E.U./dL
pH, UA: 8 (ref 5.0–8.0)

## 2018-12-23 NOTE — Progress Notes (Signed)
ROB-Doing well, working from home without difficulty. Reports dysuria and frequent urination last week that resolved with increased water intake. Hematuria on dip; will send for culture, see orders. Discussed COVID precautions and restrictions. Anticipatory guidance regarding course of prenatal care. Reviewed red flag symptoms and when to call. RTC x 3-4 weeks for ROB or sooner if needed.

## 2018-12-23 NOTE — Patient Instructions (Signed)
Fetal Movement Counts Patient Name: ________________________________________________ Patient Due Date: ____________________ What is a fetal movement count?  A fetal movement count is the number of times that you feel your baby move during a certain amount of time. This may also be called a fetal kick count. A fetal movement count is recommended for every pregnant woman. You may be asked to start counting fetal movements as early as week 28 of your pregnancy. Pay attention to when your baby is most active. You may notice your baby's sleep and wake cycles. You may also notice things that make your baby move more. You should do a fetal movement count:  When your baby is normally most active.  At the same time each day. A good time to count movements is while you are resting, after having something to eat and drink. How do I count fetal movements? 1. Find a quiet, comfortable area. Sit, or lie down on your side. 2. Write down the date, the start time and stop time, and the number of movements that you felt between those two times. Take this information with you to your health care visits. 3. For 2 hours, count kicks, flutters, swishes, rolls, and jabs. You should feel at least 10 movements during 2 hours. 4. You may stop counting after you have felt 10 movements. 5. If you do not feel 10 movements in 2 hours, have something to eat and drink. Then, keep resting and counting for 1 hour. If you feel at least 4 movements during that hour, you may stop counting. Contact a health care provider if:  You feel fewer than 4 movements in 2 hours.  Your baby is not moving like he or she usually does. Date: ____________ Start time: ____________ Stop time: ____________ Movements: ____________ Date: ____________ Start time: ____________ Stop time: ____________ Movements: ____________ Date: ____________ Start time: ____________ Stop time: ____________ Movements: ____________ Date: ____________ Start time:  ____________ Stop time: ____________ Movements: ____________ Date: ____________ Start time: ____________ Stop time: ____________ Movements: ____________ Date: ____________ Start time: ____________ Stop time: ____________ Movements: ____________ Date: ____________ Start time: ____________ Stop time: ____________ Movements: ____________ Date: ____________ Start time: ____________ Stop time: ____________ Movements: ____________ Date: ____________ Start time: ____________ Stop time: ____________ Movements: ____________ This information is not intended to replace advice given to you by your health care provider. Make sure you discuss any questions you have with your health care provider. Document Released: 09/16/2006 Document Revised: 04/15/2016 Document Reviewed: 09/26/2015 Elsevier Interactive Patient Education  2019 [employer]. Third Trimester of Pregnancy  The third trimester is from week 28 through week 40 (months 7 through 9). This trimester is when your unborn baby (fetus) is growing very fast. At the end of the ninth month, the unborn baby is about 20 inches in length. It weighs about 6-10 pounds. Follow these instructions at home: Medicines  Take over-the-counter and prescription medicines only as told by your doctor. Some medicines are safe and some medicines are not safe during pregnancy.  Take a prenatal vitamin that contains at least 600 micrograms (mcg) of folic acid.  If you have trouble pooping (constipation), take medicine that will make your stool soft (stool softener) if your doctor approves. Eating and drinking   Eat regular, healthy meals.  Avoid raw meat and uncooked cheese.  If you get low calcium from the food you eat, talk to your doctor about taking a daily calcium supplement.  Eat four or five small meals rather than three large meals a day.  Avoid foods that are high in fat and sugars, such as fried and sweet foods.  To prevent constipation: ? Eat foods  that are high in fiber, like fresh fruits and vegetables, whole grains, and beans. ? Drink enough fluids to keep your pee (urine) clear or pale yellow. Activity  Exercise only as told by your doctor. Stop exercising if you start to have cramps.  Avoid heavy lifting, wear low heels, and sit up straight.  Do not exercise if it is too hot, too humid, or if you are in a place of great height (high altitude).  You may continue to have sex unless your doctor tells you not to. Relieving pain and discomfort  Wear a good support bra if your breasts are tender.  Take frequent breaks and rest with your legs raised if you have leg cramps or low back pain.  Take warm water baths (sitz baths) to soothe pain or discomfort caused by hemorrhoids. Use hemorrhoid cream if your doctor approves.  If you develop puffy, bulging veins (varicose veins) in your legs: ? Wear support hose or compression stockings as told by your doctor. ? Raise (elevate) your feet for 15 minutes, 3-4 times a day. ? Limit salt in your food. Safety  Wear your seat belt when driving.  Make a list of emergency phone numbers, including numbers for family, friends, the hospital, and police and fire departments. Preparing for your baby's arrival To prepare for the arrival of your baby:  Take prenatal classes.  Practice driving to the hospital.  Visit the hospital and tour the maternity area.  Talk to your work about taking leave once the baby comes.  Pack your hospital bag.  Prepare the baby's room.  Go to your doctor visits.  Buy a rear-facing car seat. Learn how to install it in your car. General instructions  Do not use hot tubs, steam rooms, or saunas.  Do not use any products that contain nicotine or tobacco, such as cigarettes and e-cigarettes. If you need help quitting, ask your doctor.  Do not drink alcohol.  Do not douche or use tampons or scented sanitary pads.  Do not cross your legs for long periods  of time.  Do not travel for long distances unless you must. Only do so if your doctor says it is okay.  Visit your dentist if you have not gone during your pregnancy. Use a soft toothbrush to brush your teeth. Be gentle when you floss.  Avoid cat litter boxes and soil used by cats. These carry germs that can cause birth defects in the baby and can cause a loss of your baby (miscarriage) or stillbirth.  Keep all your prenatal visits as told by your doctor. This is important. Contact a doctor if:  You are not sure if you are in labor or if your water has broken.  You are dizzy.  You have mild cramps or pressure in your lower belly.  You have a nagging pain in your belly area.  You continue to feel sick to your stomach, you throw up, or you have watery poop.  You have bad smelling fluid coming from your vagina.  You have pain when you pee. Get help right away if:  You have a fever.  You are leaking fluid from your vagina.  You are spotting or bleeding from your vagina.  You have severe belly cramps or pain.  You lose or gain weight quickly.  You have trouble catching your breath and have  chest pain.  You notice sudden or extreme puffiness (swelling) of your face, hands, ankles, feet, or legs.  You have not felt the baby move in over an hour.  You have severe headaches that do not go away with medicine.  You have trouble seeing.  You are leaking, or you are having a gush of fluid, from your vagina before you are 37 weeks.  You have regular belly spasms (contractions) before you are 37 weeks. Summary  The third trimester is from week 28 through week 40 (months 7 through 9). This time is when your unborn baby is growing very fast.  Follow your doctor's advice about medicine, food, and activity.  Get ready for the arrival of your baby by taking prenatal classes, getting all the baby items ready, preparing the baby's room, and visiting your doctor to be checked.  Get  help right away if you are bleeding from your vagina, or you have chest pain and trouble catching your breath, or if you have not felt your baby move in over an hour. This information is not intended to replace advice given to you by your health care provider. Make sure you discuss any questions you have with your health care provider. Document Released: 11/11/2009 Document Revised: 09/22/2016 Document Reviewed: 09/22/2016 Elsevier Interactive Patient Education  2019 [employer] Inc[employer]  

## 2019-01-17 ENCOUNTER — Telehealth: Payer: Self-pay

## 2019-01-17 NOTE — Telephone Encounter (Signed)
Coronavirus (COVID-19) Are you at risk?  Are you at risk for the Coronavirus (COVID-19)?  To be considered HIGH RISK for Coronavirus (COVID-19), you have to meet the following criteria:  . Traveled to [place], [place], [place], [place] or [place]; or in the [place] to [place], [place], [place], or [place]; and have fever, cough, and shortness of breath within the last 2 weeks of travel OR . Been in close contact with a person diagnosed with COVID-19 within the last 2 weeks and have fever, cough, and shortness of breath . IF YOU DO NOT MEET THESE CRITERIA, YOU ARE CONSIDERED LOW RISK FOR COVID-19.  What to do if you are HIGH RISK for COVID-19?  . If you are having a medical emergency, call 911. . Seek medical care right away. Before you go to a doctor's office, urgent care or emergency department, call ahead and tell them about your recent travel, contact with someone diagnosed with COVID-19, and your symptoms. You should receive instructions from your physician's office regarding next steps of care.  . When you arrive at healthcare provider, tell the healthcare staff immediately you have returned from visiting [place], [place], [place], [place] or [place]; or traveled in the [place] to [place], [place], [place], or [place]; in the last two weeks or you have been in close contact with a person diagnosed with COVID-19 in the last 2 weeks.   . Tell the health care staff about your symptoms: fever, cough and shortness of breath. . After you have been seen by a medical provider, you will be either: o Tested for (COVID-19) and discharged home on quarantine except to seek medical care if symptoms worsen, and asked to  - Stay home and avoid contact with others until you get your results (4-5 days)  - Avoid travel on public transportation if possible (such as bus, train, or airplane) or o Sent to the Emergency Department by EMS for evaluation, COVID-19 testing, and possible  admission depending on your condition and test results.  What to do if you are LOW RISK for COVID-19?  Reduce your risk of any infection by using the same precautions used for avoiding the common cold or flu:  . Wash your hands often with soap and warm water for at least 20 seconds.  If soap and water are not readily available, use an alcohol-based hand sanitizer with at least 60% alcohol.  . If coughing or sneezing, cover your mouth and nose by coughing or sneezing into the elbow areas of your shirt or coat, into a tissue or into your sleeve (not your hands). . Avoid shaking hands with others and consider head nods or verbal greetings only. . Avoid touching your eyes, nose, or mouth with unwashed hands.  . Avoid close contact with people who are sick. . Avoid places or events with large numbers of people in one location, like concerts or sporting events. . Carefully consider travel plans you have or are making. . If you are planning any travel outside or inside the [place], visit the CDC's Travelers' Health webpage for the latest health notices. . If you have some symptoms but not all symptoms, continue to monitor at home and seek medical attention if your symptoms worsen. . If you are having a medical emergency, call 911.   ADDITIONAL HEALTHCARE OPTIONS FOR PATIENTS  [facility] / e-Visit: [email or web address]         [facility]: [phone]  [facility]: [phone]                   [facility]: [phone]   Pre-screen negative, DM.   

## 2019-01-18 ENCOUNTER — Ambulatory Visit: Payer: BLUE CROSS/BLUE SHIELD | Admitting: Certified Nurse Midwife

## 2019-01-18 ENCOUNTER — Other Ambulatory Visit: Payer: Self-pay

## 2019-01-18 VITALS — BP 117/66 | HR 92 | Wt 209.4 lb

## 2019-01-18 DIAGNOSIS — Z3493 Encounter for supervision of normal pregnancy, unspecified, third trimester: Secondary | ICD-10-CM

## 2019-01-18 DIAGNOSIS — R300 Dysuria: Secondary | ICD-10-CM

## 2019-01-18 LAB — POCT URINALYSIS DIPSTICK OB
Bilirubin, UA: NEGATIVE
Blood, UA: NEGATIVE
Glucose, UA: NEGATIVE
Ketones, UA: NEGATIVE
Leukocytes, UA: NEGATIVE
Nitrite, UA: NEGATIVE
POC,PROTEIN,UA: NEGATIVE
Spec Grav, UA: 1.02 (ref 1.010–1.025)
Urobilinogen, UA: 0.2 E.U./dL
pH, UA: 6.5 (ref 5.0–8.0)

## 2019-01-18 NOTE — Patient Instructions (Signed)
Group B Streptococcus Infection During Pregnancy  Group B Streptococcus (GBS) is a type of bacteria (Streptococcus agalactiae) that is often found in healthy people, commonly in the rectum, vagina, and intestines. In people who are healthy and not pregnant, the bacteria rarely cause serious illness or complications. However, women who test positive for GBS during pregnancy can pass the bacteria to their baby during childbirth, which can cause serious infection in the baby after birth. Women with GBS may also have infections during their pregnancy or immediately after childbirth, such as such as urinary tract infections (UTIs) or infections of the uterus (uterine infections). Having GBS also increases a woman's risk of complications during pregnancy, such as early (preterm) labor or delivery, miscarriage, or stillbirth. Routine testing (screening) for GBS is recommended for all pregnant women. What increases the risk? You may have a higher risk for GBS infection during pregnancy if you had one during a past pregnancy. What are the signs or symptoms? In most cases, GBS infection does not cause symptoms in pregnant women. Signs and symptoms of a possible GBS-related infection may include:  Labor starting before the 37th week of pregnancy.  A UTI or bladder infection, which may cause: ? Fever. ? Pain or burning during urination. ? Frequent urination.  Fever during labor, along with: ? Bad-smelling discharge. ? Uterine tenderness. ? Rapid heartbeat in the mother, baby, or both. Rare but serious symptoms of a possible GBS-related infection in women include:  Blood infection (septicemia). This may cause fever, chills, or confusion.  Lung infection (pneumonia). This may cause fever, chills, cough, rapid breathing, difficulty breathing, or chest pain.  Bone, joint, skin, or soft tissue infection. How is this diagnosed? You may be screened for GBS between week 35 and week 37 of your pregnancy. If  you have symptoms of preterm labor, you may be screened earlier. This condition is diagnosed based on lab test results from:  A swab of fluid from the vagina and rectum.  A urine sample. How is this treated? This condition is treated with antibiotic medicine. When you go into labor, or as soon as your water breaks (your membranes rupture), you will be given antibiotics through an IV tube. Antibiotics will continue until after you give birth. If you are having a cesarean delivery, you do not need antibiotics unless your membranes have already ruptured. Follow these instructions at home:  Take over-the-counter and prescription medicines only as told by your health care provider.  Take your antibiotic medicine as told by your health care provider. Do not stop taking the antibiotic even if you start to feel better.  Keep all pre-birth (prenatal) visits and follow-up visits as told by your health care provider. This is important. Contact a health care provider if:  You have pain or burning when you urinate.  You have to urinate frequently.  You have a fever or chills.  You develop a bad-smelling vaginal discharge. Get help right away if:  Your membranes rupture.  You go into labor.  You have severe pain in your abdomen.  You have difficulty breathing.  You have chest pain. This information is not intended to replace advice given to you by your health care provider. Make sure you discuss any questions you have with your health care provider. Document Released: 11/24/2007 Document Revised: 03/13/2016 Document Reviewed: 03/12/2016 Elsevier Interactive Patient Education  2019 [employer] Inc.  

## 2019-01-18 NOTE — Addendum Note (Signed)
Addended by: Cherly Beach on: 01/18/2019 03:19 PM   Modules accepted: Orders

## 2019-01-18 NOTE — Progress Notes (Signed)
Rob doing well. Feels good movement. Has short trip planned to beach. Covid precautions reviewed. GBS and cultures today. PT declined SVE. [facility] supports vertex presentation. Follow up 1 wk. With Melody   Doreene Burke, CNM

## 2019-01-20 LAB — STREP GP B NAA: Strep Gp B NAA: POSITIVE — AB

## 2019-01-20 LAB — URINE CULTURE

## 2019-01-24 LAB — GC/CHLAMYDIA PROBE AMP
Chlamydia trachomatis, NAA: NEGATIVE
Neisseria Gonorrhoeae by PCR: NEGATIVE

## 2019-01-26 ENCOUNTER — Telehealth: Payer: Self-pay

## 2019-01-26 NOTE — Telephone Encounter (Signed)
Coronavirus (COVID-19) Are you at risk?  Are you at risk for the Coronavirus (COVID-19)?  To be considered HIGH RISK for Coronavirus (COVID-19), you have to meet the following criteria:  . Traveled to [place], [place], [place], [place] or [place]; or in the [place] to [place], [place], [place], or [place]; and have fever, cough, and shortness of breath within the last 2 weeks of travel OR . Been in close contact with a person diagnosed with COVID-19 within the last 2 weeks and have fever, cough, and shortness of breath . IF YOU DO NOT MEET THESE CRITERIA, YOU ARE CONSIDERED LOW RISK FOR COVID-19.  What to do if you are HIGH RISK for COVID-19?  . If you are having a medical emergency, call 911. . Seek medical care right away. Before you go to a doctor's office, urgent care or emergency department, call ahead and tell them about your recent travel, contact with someone diagnosed with COVID-19, and your symptoms. You should receive instructions from your physician's office regarding next steps of care.  . When you arrive at healthcare provider, tell the healthcare staff immediately you have returned from visiting [place], [place], [place], [place] or [place]; or traveled in the [place] to [place], [place], [place], or [place]; in the last two weeks or you have been in close contact with a person diagnosed with COVID-19 in the last 2 weeks.   . Tell the health care staff about your symptoms: fever, cough and shortness of breath. . After you have been seen by a medical provider, you will be either: o Tested for (COVID-19) and discharged home on quarantine except to seek medical care if symptoms worsen, and asked to  - Stay home and avoid contact with others until you get your results (4-5 days)  - Avoid travel on public transportation if possible (such as bus, train, or airplane) or o Sent to the Emergency Department by EMS for evaluation, COVID-19 testing, and possible  admission depending on your condition and test results.  What to do if you are LOW RISK for COVID-19?  Reduce your risk of any infection by using the same precautions used for avoiding the common cold or flu:  . Wash your hands often with soap and warm water for at least 20 seconds.  If soap and water are not readily available, use an alcohol-based hand sanitizer with at least 60% alcohol.  . If coughing or sneezing, cover your mouth and nose by coughing or sneezing into the elbow areas of your shirt or coat, into a tissue or into your sleeve (not your hands). . Avoid shaking hands with others and consider head nods or verbal greetings only. . Avoid touching your eyes, nose, or mouth with unwashed hands.  . Avoid close contact with people who are Crysten Kaman. . Avoid places or events with large numbers of people in one location, like concerts or sporting events. . Carefully consider travel plans you have or are making. . If you are planning any travel outside or inside the [place], visit the CDC's Travelers' Health webpage for the latest health notices. . If you have some symptoms but not all symptoms, continue to monitor at home and seek medical attention if your symptoms worsen. . If you are having a medical emergency, call 911.  01/26/19 SCREENING [facility] ADDITIONAL HEALTHCARE OPTIONS FOR PATIENTS   Telehealth / e-Visit: [email or web address]         [facility]: [phone]    [facility]: [phone]                   [facility]: [phone]  

## 2019-01-27 ENCOUNTER — Ambulatory Visit: Payer: BLUE CROSS/BLUE SHIELD | Admitting: Obstetrics and Gynecology

## 2019-01-27 ENCOUNTER — Other Ambulatory Visit: Payer: Self-pay

## 2019-01-27 VITALS — BP 132/75 | HR 103 | Wt 211.4 lb

## 2019-01-27 DIAGNOSIS — Z3493 Encounter for supervision of normal pregnancy, unspecified, third trimester: Secondary | ICD-10-CM

## 2019-01-27 LAB — POCT URINALYSIS DIPSTICK OB
Bilirubin, UA: NEGATIVE
Blood, UA: NEGATIVE
Glucose, UA: NEGATIVE
Ketones, UA: NEGATIVE
Nitrite, UA: NEGATIVE
POC,PROTEIN,UA: NEGATIVE
Spec Grav, UA: 1.015 (ref 1.010–1.025)
Urobilinogen, UA: 0.2 E.U./dL
pH, UA: 7 (ref 5.0–8.0)

## 2019-01-27 NOTE — Progress Notes (Signed)
ROB-discussed GBS prophylaxis, and labor s/s

## 2019-01-27 NOTE — Progress Notes (Signed)
ROB- pt is doing well 

## 2019-01-31 ENCOUNTER — Telehealth: Payer: Self-pay

## 2019-01-31 NOTE — Telephone Encounter (Signed)
Coronavirus (COVID-19) Are you at risk?  Are you at risk for the Coronavirus (COVID-19)?  To be considered HIGH RISK for Coronavirus (COVID-19), you have to meet the following criteria:  . Traveled to [place], [place], [place], [place] or [place]; or in the [place] to [place], [place], [place], or [place]; and have fever, cough, and shortness of breath within the last 2 weeks of travel OR . Been in close contact with a person diagnosed with COVID-19 within the last 2 weeks and have fever, cough, and shortness of breath . IF YOU DO NOT MEET THESE CRITERIA, YOU ARE CONSIDERED LOW RISK FOR COVID-19.  What to do if you are HIGH RISK for COVID-19?  . If you are having a medical emergency, call 911. . Seek medical care right away. Before you go to a doctor's office, urgent care or emergency department, call ahead and tell them about your recent travel, contact with someone diagnosed with COVID-19, and your symptoms. You should receive instructions from your physician's office regarding next steps of care.  . When you arrive at healthcare provider, tell the healthcare staff immediately you have returned from visiting [place], [place], [place], [place] or [place]; or traveled in the [place] to [place], [place], [place], or [place]; in the last two weeks or you have been in close contact with a person diagnosed with COVID-19 in the last 2 weeks.   . Tell the health care staff about your symptoms: fever, cough and shortness of breath. . After you have been seen by a medical provider, you will be either: o Tested for (COVID-19) and discharged home on quarantine except to seek medical care if symptoms worsen, and asked to  - Stay home and avoid contact with others until you get your results (4-5 days)  - Avoid travel on public transportation if possible (such as bus, train, or airplane) or o Sent to the Emergency Department by EMS for evaluation, COVID-19 testing, and possible  admission depending on your condition and test results.  What to do if you are LOW RISK for COVID-19?  Reduce your risk of any infection by using the same precautions used for avoiding the common cold or flu:  . Wash your hands often with soap and warm water for at least 20 seconds.  If soap and water are not readily available, use an alcohol-based hand sanitizer with at least 60% alcohol.  . If coughing or sneezing, cover your mouth and nose by coughing or sneezing into the elbow areas of your shirt or coat, into a tissue or into your sleeve (not your hands). . Avoid shaking hands with others and consider head nods or verbal greetings only. . Avoid touching your eyes, nose, or mouth with unwashed hands.  . Avoid close contact with people who are sick. . Avoid places or events with large numbers of people in one location, like concerts or sporting events. . Carefully consider travel plans you have or are making. . If you are planning any travel outside or inside the [place], visit the CDC's Travelers' Health webpage for the latest health notices. . If you have some symptoms but not all symptoms, continue to monitor at home and seek medical attention if your symptoms worsen. . If you are having a medical emergency, call 911.   ADDITIONAL HEALTHCARE OPTIONS FOR PATIENTS  [facility] / e-Visit: [email or web address]         [facility]: [phone]  [facility]: [phone]                   [facility]: [phone]   Pre-screen negative, DM.   

## 2019-02-01 ENCOUNTER — Encounter: Payer: Self-pay | Admitting: Certified Nurse Midwife

## 2019-02-01 ENCOUNTER — Other Ambulatory Visit: Payer: Self-pay

## 2019-02-01 ENCOUNTER — Ambulatory Visit: Payer: BLUE CROSS/BLUE SHIELD | Admitting: Certified Nurse Midwife

## 2019-02-01 VITALS — BP 115/72 | HR 82 | Wt 210.4 lb

## 2019-02-01 DIAGNOSIS — Z3493 Encounter for supervision of normal pregnancy, unspecified, third trimester: Secondary | ICD-10-CM

## 2019-02-01 LAB — POCT URINALYSIS DIPSTICK OB
Bilirubin, UA: NEGATIVE
Blood, UA: NEGATIVE
Glucose, UA: NEGATIVE
Ketones, UA: NEGATIVE
Leukocytes, UA: NEGATIVE
Nitrite, UA: NEGATIVE
POC,PROTEIN,UA: NEGATIVE
Spec Grav, UA: 1.02 (ref 1.010–1.025)
Urobilinogen, UA: 0.2 E.U./dL
pH, UA: 6 (ref 5.0–8.0)

## 2019-02-01 NOTE — Patient Instructions (Signed)
Braxton Hicks Contractions Contractions of the uterus can occur throughout pregnancy, but they are not always a sign that you are in labor. You may have practice contractions called Braxton Hicks contractions. These false labor contractions are sometimes confused with true labor. What are Braxton Hicks contractions? Braxton Hicks contractions are tightening movements that occur in the muscles of the uterus before labor. Unlike true labor contractions, these contractions do not result in opening (dilation) and thinning of the cervix. Toward the end of pregnancy (32-34 weeks), Braxton Hicks contractions can happen more often and may become stronger. These contractions are sometimes difficult to tell apart from true labor because they can be very uncomfortable. You should not feel embarrassed if you go to the hospital with false labor. Sometimes, the only way to tell if you are in true labor is for your health care provider to look for changes in the cervix. The health care provider will do a physical exam and may monitor your contractions. If you are not in true labor, the exam should show that your cervix is not dilating and your water has not broken. If there are no other health problems associated with your pregnancy, it is completely safe for you to be sent home with false labor. You may continue to have Braxton Hicks contractions until you go into true labor. How to tell the difference between true labor and false labor True labor  Contractions last 30-70 seconds.  Contractions become very regular.  Discomfort is usually felt in the top of the uterus, and it spreads to the lower abdomen and low back.  Contractions do not go away with walking.  Contractions usually become more intense and increase in frequency.  The cervix dilates and gets thinner. False labor  Contractions are usually shorter and not as strong as true labor contractions.  Contractions are usually irregular.  Contractions  are often felt in the front of the lower abdomen and in the groin.  Contractions may go away when you walk around or change positions while lying down.  Contractions get weaker and are shorter-lasting as time goes on.  The cervix usually does not dilate or become thin. Follow these instructions at home:   Take over-the-counter and prescription medicines only as told by your health care provider.  Keep up with your usual exercises and follow other instructions from your health care provider.  Eat and drink lightly if you think you are going into labor.  If Braxton Hicks contractions are making you uncomfortable: ? Change your position from lying down or resting to walking, or change from walking to resting. ? Sit and rest in a tub of warm water. ? Drink enough fluid to keep your urine pale yellow. Dehydration may cause these contractions. ? Do slow and deep breathing several times an hour.  Keep all follow-up prenatal visits as told by your health care provider. This is important. Contact a health care provider if:  You have a fever.  You have continuous pain in your abdomen. Get help right away if:  Your contractions become stronger, more regular, and closer together.  You have fluid leaking or gushing from your vagina.  You pass blood-tinged mucus (bloody show).  You have bleeding from your vagina.  You have low back pain that you never had before.  You feel your baby's head pushing down and causing pelvic pressure.  Your baby is not moving inside you as much as it used to. Summary  Contractions that occur before labor are   called Braxton Hicks contractions, false labor, or practice contractions.  Braxton Hicks contractions are usually shorter, weaker, farther apart, and less regular than true labor contractions. True labor contractions usually become progressively stronger and regular, and they become more frequent.  Manage discomfort from Braxton Hicks contractions  by changing position, resting in a warm bath, drinking plenty of water, or practicing deep breathing. This information is not intended to replace advice given to you by your health care provider. Make sure you discuss any questions you have with your health care provider. Document Released: 12/31/2016 Document Revised: 06/01/2017 Document Reviewed: 12/31/2016 Elsevier Interactive Patient Education  2019 Elsevier Inc.  

## 2019-02-01 NOTE — Progress Notes (Signed)
ROB doing well. Feels good movement. Continues to have some mild irregular contractions. Declines SVE today. Labor precautions reviewed. Follow up 1 wk.   Doreene Burke, CNM

## 2019-02-08 ENCOUNTER — Telehealth: Payer: Self-pay | Admitting: *Deleted

## 2019-02-08 NOTE — Telephone Encounter (Signed)
Coronavirus (COVID-19) Are you at risk?  Are you at risk for the Coronavirus (COVID-19)?  To be considered HIGH RISK for Coronavirus (COVID-19), you have to meet the following criteria:  . Traveled to [place], [place], [place], [place] or [place]; or in the [place] to [place], [place], [place], or [place]; and have fever, cough, and shortness of breath within the last 2 weeks of travel OR . Been in close contact with a person diagnosed with COVID-19 within the last 2 weeks and have fever, cough, and shortness of breath . IF YOU DO NOT MEET THESE CRITERIA, YOU ARE CONSIDERED LOW RISK FOR COVID-19.  What to do if you are HIGH RISK for COVID-19?  . If you are having a medical emergency, call 911. . Seek medical care right away. Before you go to a doctor's office, urgent care or emergency department, call ahead and tell them about your recent travel, contact with someone diagnosed with COVID-19, and your symptoms. You should receive instructions from your physician's office regarding next steps of care.  . When you arrive at healthcare provider, tell the healthcare staff immediately you have returned from visiting [place], [place], [place], [place] or [place]; or traveled in the [place] to [place], [place], [place], or [place]; in the last two weeks or you have been in close contact with a person diagnosed with COVID-19 in the last 2 weeks.   . Tell the health care staff about your symptoms: fever, cough and shortness of breath. . After you have been seen by a medical provider, you will be either: o Tested for (COVID-19) and discharged home on quarantine except to seek medical care if symptoms worsen, and asked to  - Stay home and avoid contact with others until you get your results (4-5 days)  - Avoid travel on public transportation if possible (such as bus, train, or airplane) or o Sent to the Emergency Department by EMS for evaluation, COVID-19 testing, and possible  admission depending on your condition and test results.  What to do if you are LOW RISK for COVID-19?  Reduce your risk of any infection by using the same precautions used for avoiding the common cold or flu:  . Wash your hands often with soap and warm water for at least 20 seconds.  If soap and water are not readily available, use an alcohol-based hand sanitizer with at least 60% alcohol.  . If coughing or sneezing, cover your mouth and nose by coughing or sneezing into the elbow areas of your shirt or coat, into a tissue or into your sleeve (not your hands). . Avoid shaking hands with others and consider head nods or verbal greetings only. . Avoid touching your eyes, nose, or mouth with unwashed hands.  . Avoid close contact with people who are sick. . Avoid places or events with large numbers of people in one location, like concerts or sporting events. . Carefully consider travel plans you have or are making. . If you are planning any travel outside or inside the [place], visit the CDC's Travelers' Health webpage for the latest health notices. . If you have some symptoms but not all symptoms, continue to monitor at home and seek medical attention if your symptoms worsen. . If you are having a medical emergency, call 911.   ADDITIONAL HEALTHCARE OPTIONS FOR PATIENTS  [facility] / e-Visit: [email or web address]         [facility]: [phone]  [facility]: [phone]                   [facility]: [phone]   Spoke with pt denies any sx.  Amy Clontz, CMA 

## 2019-02-09 ENCOUNTER — Other Ambulatory Visit: Payer: Self-pay

## 2019-02-09 ENCOUNTER — Ambulatory Visit: Payer: BC Managed Care – PPO | Admitting: Obstetrics and Gynecology

## 2019-02-09 VITALS — BP 127/71 | HR 89 | Wt 212.2 lb

## 2019-02-09 DIAGNOSIS — Z3493 Encounter for supervision of normal pregnancy, unspecified, third trimester: Secondary | ICD-10-CM

## 2019-02-09 LAB — POCT URINALYSIS DIPSTICK OB
Bilirubin, UA: NEGATIVE
Blood, UA: NEGATIVE
Glucose, UA: NEGATIVE
Ketones, UA: NEGATIVE
Nitrite, UA: NEGATIVE
POC,PROTEIN,UA: NEGATIVE
Spec Grav, UA: 1.01 (ref 1.010–1.025)
Urobilinogen, UA: 0.2 E.U./dL
pH, UA: 7 (ref 5.0–8.0)

## 2019-02-09 NOTE — Progress Notes (Signed)
ROB- pt is having pelvic pressure 

## 2019-02-09 NOTE — Progress Notes (Signed)
ROB- discussed labor precautions,  

## 2019-02-15 ENCOUNTER — Telehealth: Payer: Self-pay

## 2019-02-15 NOTE — Telephone Encounter (Signed)
Coronavirus (COVID-19) Are you at risk?  Are you at risk for the Coronavirus (COVID-19)?  To be considered HIGH RISK for Coronavirus (COVID-19), you have to meet the following criteria:  . Traveled to [place], [place], [place], [place] or [place]; or in the [place] to [place], [place], [place], or [place]; and have fever, cough, and shortness of breath within the last 2 weeks of travel OR . Been in close contact with a person diagnosed with COVID-19 within the last 2 weeks and have fever, cough, and shortness of breath . IF YOU DO NOT MEET THESE CRITERIA, YOU ARE CONSIDERED LOW RISK FOR COVID-19.  What to do if you are HIGH RISK for COVID-19?  . If you are having a medical emergency, call 911. . Seek medical care right away. Before you go to a doctor's office, urgent care or emergency department, call ahead and tell them about your recent travel, contact with someone diagnosed with COVID-19, and your symptoms. You should receive instructions from your physician's office regarding next steps of care.  . When you arrive at healthcare provider, tell the healthcare staff immediately you have returned from visiting [place], [place], [place], [place] or [place]; or traveled in the [place] to [place], [place], [place], or [place]; in the last two weeks or you have been in close contact with a person diagnosed with COVID-19 in the last 2 weeks.   . Tell the health care staff about your symptoms: fever, cough and shortness of breath. . After you have been seen by a medical provider, you will be either: o Tested for (COVID-19) and discharged home on quarantine except to seek medical care if symptoms worsen, and asked to  - Stay home and avoid contact with others until you get your results (4-5 days)  - Avoid travel on public transportation if possible (such as bus, train, or airplane) or o Sent to the Emergency Department by EMS for evaluation, COVID-19 testing, and possible  admission depending on your condition and test results.  What to do if you are LOW RISK for COVID-19?  Reduce your risk of any infection by using the same precautions used for avoiding the common cold or flu:  . Wash your hands often with soap and warm water for at least 20 seconds.  If soap and water are not readily available, use an alcohol-based hand sanitizer with at least 60% alcohol.  . If coughing or sneezing, cover your mouth and nose by coughing or sneezing into the elbow areas of your shirt or coat, into a tissue or into your sleeve (not your hands). . Avoid shaking hands with others and consider head nods or verbal greetings only. . Avoid touching your eyes, nose, or mouth with unwashed hands.  . Avoid close contact with people who are Joanna Page. . Avoid places or events with large numbers of people in one location, like concerts or sporting events. . Carefully consider travel plans you have or are making. . If you are planning any travel outside or inside the [place], visit the CDC's Travelers' Health webpage for the latest health notices. . If you have some symptoms but not all symptoms, continue to monitor at home and seek medical attention if your symptoms worsen. . If you are having a medical emergency, call 911.  02/15/19 SCREENING NEG SLS ADDITIONAL HEALTHCARE OPTIONS FOR PATIENTS  [facility] / e-Visit: [email or web address]         [facility]: [phone]    [facility]: [phone]                   [facility]: [phone]  

## 2019-02-16 ENCOUNTER — Other Ambulatory Visit: Payer: Self-pay

## 2019-02-16 ENCOUNTER — Ambulatory Visit: Payer: BC Managed Care – PPO | Admitting: Certified Nurse Midwife

## 2019-02-16 ENCOUNTER — Encounter: Payer: Self-pay | Admitting: Certified Nurse Midwife

## 2019-02-16 VITALS — BP 123/81 | HR 86 | Wt 209.8 lb

## 2019-02-16 DIAGNOSIS — Z3493 Encounter for supervision of normal pregnancy, unspecified, third trimester: Secondary | ICD-10-CM

## 2019-02-16 DIAGNOSIS — O34219 Maternal care for unspecified type scar from previous cesarean delivery: Secondary | ICD-10-CM

## 2019-02-16 DIAGNOSIS — O480 Post-term pregnancy: Secondary | ICD-10-CM

## 2019-02-16 DIAGNOSIS — B951 Streptococcus, group B, as the cause of diseases classified elsewhere: Secondary | ICD-10-CM | POA: Insufficient documentation

## 2019-02-16 LAB — POCT URINALYSIS DIPSTICK OB
Bilirubin, UA: NEGATIVE
Glucose, UA: NEGATIVE
Ketones, UA: NEGATIVE
Leukocytes, UA: NEGATIVE
Nitrite, UA: NEGATIVE
POC,PROTEIN,UA: NEGATIVE
Spec Grav, UA: 1.015 (ref 1.010–1.025)
Urobilinogen, UA: 0.2 E.U./dL
pH, UA: 7.5 (ref 5.0–8.0)

## 2019-02-16 NOTE — Patient Instructions (Signed)

## 2019-02-16 NOTE — Progress Notes (Signed)
ROB- pt having pelvic pressure

## 2019-02-16 NOTE — Progress Notes (Signed)
ROB-Reports pelvic pressure. Requests SVE. Discussed home labor preparation techniques. Education regarding postdates care. Reviewed red flag symptoms and when to call. RTC x 1 week for BPP/[place] [place] and ROB or sooner if needed.

## 2019-02-17 ENCOUNTER — Inpatient Hospital Stay
Admission: EM | Admit: 2019-02-17 | Discharge: 2019-02-19 | DRG: 807 | Disposition: A | Discharge status: Home / Self Care | Payer: BC Managed Care – PPO | Attending: Certified Nurse Midwife | Admitting: Certified Nurse Midwife

## 2019-02-17 DIAGNOSIS — Z3480 Encounter for supervision of other normal pregnancy, unspecified trimester: Secondary | ICD-10-CM

## 2019-02-17 DIAGNOSIS — O34211 Maternal care for low transverse scar from previous cesarean delivery: Secondary | ICD-10-CM

## 2019-02-17 DIAGNOSIS — O34219 Maternal care for unspecified type scar from previous cesarean delivery: Secondary | ICD-10-CM | POA: Diagnosis present

## 2019-02-17 DIAGNOSIS — O99824 Streptococcus B carrier state complicating childbirth: Secondary | ICD-10-CM | POA: Diagnosis present

## 2019-02-17 DIAGNOSIS — Z1159 Encounter for screening for other viral diseases: Secondary | ICD-10-CM | POA: Diagnosis not present

## 2019-02-17 DIAGNOSIS — Z3A39 39 weeks gestation of pregnancy: Secondary | ICD-10-CM | POA: Diagnosis not present

## 2019-02-17 DIAGNOSIS — O26893 Other specified pregnancy related conditions, third trimester: Secondary | ICD-10-CM | POA: Diagnosis present

## 2019-02-17 DIAGNOSIS — B951 Streptococcus, group B, as the cause of diseases classified elsewhere: Secondary | ICD-10-CM

## 2019-02-17 LAB — SARS CORONAVIRUS 2 BY RT PCR (HOSPITAL ORDER, PERFORMED IN ~~LOC~~ HOSPITAL LAB): SARS Coronavirus 2: NEGATIVE

## 2019-02-17 LAB — TYPE AND SCREEN
ABO/RH(D): A POS
Antibody Screen: NEGATIVE

## 2019-02-17 MED ORDER — FENTANYL CITRATE (PF) 100 MCG/2ML IJ SOLN
100.0000 ug | Freq: Once | INTRAMUSCULAR | Status: AC
  Administered 2019-02-17: 100 ug via INTRAVENOUS
  Filled 2019-02-17: qty 2

## 2019-02-17 MED ORDER — SENNOSIDES-DOCUSATE SODIUM 8.6-50 MG PO TABS
2.0000 | ORAL_TABLET | ORAL | Status: DC
  Administered 2019-02-18: 2 via ORAL
  Filled 2019-02-17: qty 2

## 2019-02-17 MED ORDER — LACTATED RINGERS IV SOLN: 500.0000 mL | INTRAVENOUS | Status: DC | PRN

## 2019-02-17 MED ORDER — ONDANSETRON HCL 4 MG/2ML IJ SOLN: 4.0000 mg | Freq: Four times a day (QID) | INTRAMUSCULAR | Status: DC | PRN

## 2019-02-17 MED ORDER — METHYLERGONOVINE MALEATE 0.2 MG/ML IJ SOLN
0.2000 mg | Freq: Once | INTRAMUSCULAR | Status: AC
  Administered 2019-02-17: 0.2 mg via INTRAMUSCULAR

## 2019-02-17 MED ORDER — ONDANSETRON HCL 4 MG/2ML IJ SOLN: 4.0000 mg | INTRAMUSCULAR | Status: DC | PRN

## 2019-02-17 MED ORDER — ONDANSETRON HCL 4 MG PO TABS: 4.0000 mg | ORAL_TABLET | ORAL | Status: DC | PRN

## 2019-02-17 MED ORDER — PRENATAL MULTIVITAMIN CH
1.0000 | ORAL_TABLET | Freq: Every day | ORAL | Status: DC
  Administered 2019-02-17 – 2019-02-19 (×3): 1 via ORAL
  Filled 2019-02-17 (×3): qty 1

## 2019-02-17 MED ORDER — OXYCODONE-ACETAMINOPHEN 5-325 MG PO TABS: 2.0000 | ORAL_TABLET | ORAL | Status: DC | PRN

## 2019-02-17 MED ORDER — OXYTOCIN 40 UNITS IN NORMAL SALINE INFUSION - SIMPLE MED
2.5000 [IU]/h | INTRAVENOUS | Status: DC
  Administered 2019-02-17: 2.5 [IU]/h via INTRAVENOUS

## 2019-02-17 MED ORDER — OXYTOCIN 40 UNITS IN NORMAL SALINE INFUSION - SIMPLE MED: 1.0000 m[IU]/min | INTRAVENOUS | Status: DC

## 2019-02-17 MED ORDER — METHYLERGONOVINE MALEATE 0.2 MG PO TABS: 0.2000 mg | ORAL_TABLET | ORAL | Status: DC | PRN

## 2019-02-17 MED ORDER — WITCH HAZEL-GLYCERIN EX PADS: 1.0000 "application " | MEDICATED_PAD | CUTANEOUS | Status: DC | PRN

## 2019-02-17 MED ORDER — SIMETHICONE 80 MG PO CHEW: 80.0000 mg | CHEWABLE_TABLET | ORAL | Status: DC | PRN

## 2019-02-17 MED ORDER — ACETAMINOPHEN 325 MG PO TABS: 650.0000 mg | ORAL_TABLET | ORAL | Status: DC | PRN

## 2019-02-17 MED ORDER — METHYLERGONOVINE MALEATE 0.2 MG/ML IJ SOLN: 0.2000 mg | INTRAMUSCULAR | Status: DC | PRN

## 2019-02-17 MED ORDER — COCONUT OIL OIL
1.0000 "application " | TOPICAL_OIL | Status: DC | PRN
  Administered 2019-02-18: 1 via TOPICAL
  Filled 2019-02-17: qty 120

## 2019-02-17 MED ORDER — LIDOCAINE HCL (PF) 1 % IJ SOLN
30.0000 mL | INTRAMUSCULAR | Status: DC | PRN
  Administered 2019-02-17: 30 mL via SUBCUTANEOUS

## 2019-02-17 MED ORDER — OXYCODONE-ACETAMINOPHEN 5-325 MG PO TABS: 1.0000 | ORAL_TABLET | ORAL | Status: DC | PRN

## 2019-02-17 MED ORDER — BENZOCAINE-MENTHOL 20-0.5 % EX AERO: 1.0000 "application " | INHALATION_SPRAY | CUTANEOUS | Status: DC | PRN

## 2019-02-17 MED ORDER — DIBUCAINE (PERIANAL) 1 % EX OINT: 1.0000 "application " | TOPICAL_OINTMENT | CUTANEOUS | Status: DC | PRN

## 2019-02-17 MED ORDER — DOCUSATE SODIUM 100 MG PO CAPS
100.0000 mg | ORAL_CAPSULE | Freq: Two times a day (BID) | ORAL | Status: DC
  Administered 2019-02-17 – 2019-02-19 (×4): 100 mg via ORAL
  Filled 2019-02-17 (×4): qty 1

## 2019-02-17 MED ORDER — IBUPROFEN 600 MG PO TABS
600.0000 mg | ORAL_TABLET | Freq: Four times a day (QID) | ORAL | Status: DC
  Administered 2019-02-17 – 2019-02-19 (×9): 600 mg via ORAL
  Filled 2019-02-17 (×9): qty 1

## 2019-02-17 MED ORDER — METHYLERGONOVINE MALEATE 0.2 MG/ML IJ SOLN
INTRAMUSCULAR | Status: AC
  Filled 2019-02-17: qty 1

## 2019-02-17 MED ORDER — LACTATED RINGERS IV SOLN: INTRAVENOUS | Status: DC

## 2019-02-17 MED ORDER — OXYTOCIN 10 UNIT/ML IJ SOLN
10.0000 [IU] | Freq: Once | INTRAMUSCULAR | Status: AC
  Administered 2019-02-17: 10 [IU] via INTRAMUSCULAR

## 2019-02-17 MED ORDER — OXYTOCIN 40 UNITS IN NORMAL SALINE INFUSION - SIMPLE MED: 2.5000 [IU]/h | INTRAVENOUS | Status: DC

## 2019-02-17 MED ORDER — LIDOCAINE HCL (PF) 1 % IJ SOLN: 30.0000 mL | Freq: Once | INTRAMUSCULAR | Status: DC

## 2019-02-17 MED ORDER — BUTORPHANOL TARTRATE 2 MG/ML IJ SOLN: 1.0000 mg | INTRAMUSCULAR | Status: DC | PRN

## 2019-02-17 MED ORDER — FERROUS SULFATE 325 (65 FE) MG PO TABS
325.0000 mg | ORAL_TABLET | Freq: Every day | ORAL | Status: DC
  Administered 2019-02-17 – 2019-02-19 (×3): 325 mg via ORAL
  Filled 2019-02-17 (×3): qty 1

## 2019-02-17 MED ORDER — OXYTOCIN BOLUS FROM INFUSION
500.0000 mL | Freq: Once | INTRAVENOUS | Status: DC
  Administered 2019-02-17: 500 mL via INTRAVENOUS

## 2019-02-17 MED ORDER — SODIUM CHLORIDE 0.9 % IV SOLN: 2.0000 g | Freq: Once | INTRAVENOUS | Status: DC

## 2019-02-17 MED ORDER — SOD CITRATE-CITRIC ACID 500-334 MG/5ML PO SOLN: 30.0000 mL | ORAL | Status: DC | PRN

## 2019-02-17 MED ORDER — MISOPROSTOL 200 MCG PO TABS
800.0000 ug | ORAL_TABLET | Freq: Once | ORAL | Status: AC
  Administered 2019-02-17: 03:00:00 800 ug via RECTAL

## 2019-02-17 NOTE — H&P (Signed)
History and Physical   HPI  Joanna Page is a 33 y.o. [id number] at 111w5d Estimated Date of Delivery: 02/19/19 who is being admitted for labor management.   OB History  OB History  Gravida Para Term Preterm AB Living  4 1 1  0 1 2  SAB TAB Ectopic Multiple Live Births  0 0 1 0 2    # Outcome Date GA Lbr Len/2nd Weight Sex Delivery Anes PTL Lv  4 Current           3 Term 02/06/15 5475w0d 02:24 / 00:13 3260 g M VBAC, Vacuum None  LIV     Apgar1: 8  Apgar5: 9  2 Gravida 06/04/13    M CS-LTranv   LIV     Complications: Fetal Intolerance  1 Ectopic             PROBLEM LIST  Pregnancy complications or risks: Patient Active Problem List   Diagnosis Date Noted  . Labor and delivery, indication for care 02/17/2019  . Positive GBS test 02/16/2019  . History of cesarean section 08/29/2018  . Supervision of other normal pregnancy, antepartum 08/29/2018  . History of VBAC 02/08/2015    Prenatal labs and studies: ABO, Rh: A/Positive/-- (12/30 1033) Antibody: Negative (12/30 1033) Rubella: 5.71 (12/30 1033) RPR: Non Reactive (03/26 0912)  HBsAg: Negative (12/30 1033)  HIV: Non Reactive (12/30 1033)  AVW:UJWJXBJYGBS:Positive (05/20 1104)   History reviewed. No pertinent past medical history.   Past Surgical History:  Procedure Laterality Date  . WISDOM TOOTH EXTRACTION       Medications    Current Discharge Medication List    CONTINUE these medications which have NOT CHANGED   Details  Prenatal Vit-Fe Fumarate-FA (PRENATAL MULTIVITAMIN) TABS tablet Take 1 tablet by mouth daily.          Allergies  Patient has no known allergies.  Review of Systems  Constitutional: negative Eyes: negative Ears, nose, mouth, throat, and face: negative Respiratory: negative Cardiovascular: negative Gastrointestinal: negative Genitourinary:negative Integument/breast: negative Hematologic/lymphatic: negative Musculoskeletal:negative Neurological: negative Behavioral/Psych:  negative Endocrine: negative Allergic/Immunologic: negative  Physical Exam  Temp 98 F (36.7 C) (Oral)   Ht 5\' 10"  (1.778 m)   Wt 95.2 kg   LMP 05/15/2018   BMI 30.10 kg/m   Lungs:  CTA B Cardio: RRR Abd: Soft, gravid, NT Presentation: cephalic EXT: No C/C/ 1+ Edema DTRs: 2+ B CERVIX: Dilation: 6.5 Effacement (%): 90, 100 Station: -2 Exam by:: Charlane FerrettiP Funk RN   See Prenatal records for more detailed PE.     FHR:  Baseline: 120 bpm, Variability: Good {> 6 bpm), Accelerations: Reactive and Decelerations: Absent  Toco: Uterine Contractions: Frequency: Every 2-4 minutes, Duration: 60-90 seconds and Intensity: strong   Test Results  Results for orders placed or performed in visit on 02/16/19 (from the past 24 hour(s))  POC Urinalysis Dipstick OB     Status: None   Collection Time: 02/16/19  1:35 PM  Result Value Ref Range   Color, UA pale yellow    Clarity, UA clear    Glucose, UA Negative Negative   Bilirubin, UA neg    Ketones, UA neg    Spec Grav, UA 1.015 1.010 - 1.025   Blood, UA small    pH, UA 7.5 5.0 - 8.0   POC,PROTEIN,UA Negative Negative, Trace, Small (1+), Moderate (2+), Large (3+), 4+   Urobilinogen, UA 0.2 0.2 or 1.0 E.U./dL   Nitrite, UA neg    Leukocytes, UA Negative Negative  Appearance     Odor     Group B Strep positive  Assessment   G4P1012 at [redacted]w[redacted]d Estimated Date of Delivery: 02/19/19  The fetus is reassuring.    Patient Active Problem List   Diagnosis Date Noted  . Labor and delivery, indication for care 02/17/2019  . Positive GBS test 02/16/2019  . History of cesarean section 08/29/2018  . Supervision of other normal pregnancy, antepartum 08/29/2018  . History of VBAC 02/08/2015    Plan  1. Admit to L&D :   expectant management 2. EFM:-- Category 1 3. Epidural if desired.  Stadol for IV pain until epidural requested. 4. Admission labs  5. Anticipate NSVD   Philip Aspen, CNM  02/17/2019 3:39 AM

## 2019-02-17 NOTE — OB Triage Note (Cosign Needed)
Pt arrival to triage with c/o contractions every 2-38min, starting just before 0100.  Pt denies vaginal bleeding and LOF.  States positive fetal movement.  EFM and toco applied and assessing.

## 2019-02-17 NOTE — Lactation Note (Addendum)
This note was copied from a baby's chart. Lactation Consultation Note  Patient Name: Boy Lanasia Porras [id number] Date: 02/17/2019 Reason for consult: Initial assessment;Mother's request;Difficult latch Sleepy after circ and delivery also  Maternal Data Formula Feeding for Exclusion: No Has patient been taught Hand Expression?: Yes Does the patient have breastfeeding experience prior to this delivery?: Yes(attempted) MOm attempted with last child but ended up pumping and bottlefeeding x 2 mths, had good milk production, formula fed first child Feeding  Would open mouth but not attempt to latch or suck  LATCH Score Latch: Too sleepy or reluctant, no latch achieved, no sucking elicited.     Type of Nipple: Flat(sl everted but flattens when compresses)           Interventions Interventions: Breast feeding basics reviewed;Assisted with latch;Skin to skin;Hand express;Breast compression;Adjust position;Support pillows;Position options  Lactation Tools Discussed/Used WIC Program: No   Consult Status Consult Status: Follow-up Date: 02/17/19 Follow-up type: In-patient    Ferol Luz 02/17/2019, 11:56 AM

## 2019-02-17 NOTE — Lactation Note (Signed)
This note was copied from a baby's chart. Lactation Consultation Note  Patient Name: Joanna Page [id number] Date: 02/17/2019 Reason for consult: Follow-up assessment   Maternal Data  mom's nipples more everted, but still tend to flatten if compressed, right breast more firm in right outer quad, softer around areola than left  Feeding Feeding Type: Breast Fed x 7 min on left with swallows and burp after, few sucks on right, more sedate, mom will attempt on this breast at next feeding  LATCH Score Latch: Repeated attempts needed to sustain latch, nipple held in mouth throughout feeding, stimulation needed to elicit sucking reflex.  Audible Swallowing: Spontaneous and intermittent  Type of Nipple: Everted at rest and after stimulation  Comfort (Breast/Nipple): Soft / non-tender  Hold (Positioning): Assistance needed to correctly position infant at breast and maintain latch.  LATCH Score: 8  Interventions Interventions: Assisted with latch;Skin to skin;Hand express;Breast compression;Adjust position;Support pillows;Position options  Lactation Tools Discussed/Used     Consult Status Consult Status: Follow-up Date: 02/18/19 Follow-up type: In-patient    Ferol Luz 02/17/2019, 6:07 PM

## 2019-02-17 NOTE — Progress Notes (Signed)
Patient ID: Joanna Page, female   DOB: 25-Nov-1985, 33 y.o.   MRN: [id number]  Post Partum Day # 0, s/p spontaneous vaginal birth after cesarean   Subjective:  Patient doing well. Sitting in bed holding infant. Questions answered regarding Group Beta Strep. FOB at bedside.   Denies difficulty breathing or respiratory distress, chest pain, abdominal pain, excessive vaginal bleeding, dysuria, and leg pain or swelling.   Objective:  Temp:  [98 F (36.7 C)-98.5 F (36.9 C)] 98.1 F (36.7 C) (06/19 0748) Pulse Rate:  [72-245] 81 (06/19 0748) Resp:  [14-18] 18 (06/19 0748) BP: (106-140)/(64-96) 123/78 (06/19 0748) SpO2:  [100 %] 100 % (06/19 0748) Weight:  [95.2 kg] 95.2 kg (06/19 0227)  Physical Exam:   General: alert and cooperative   Lungs: clear to auscultation bilaterally  Breasts: normal appearance, no masses or tenderness; nipple shield to right breast  Heart: normal apical impulse  Abdomen: soft, non-tender; bowel sounds normal; no masses,  no organomegaly  Pelvis: Lochia: appropriate, Uterine Fundus: firm  Extremities: DVT Evaluation: No evidence of DVT seen on physical exam.  Assessment:  33 year old G4P3, Post Partum Day # 0, s/p spontaneous vaginal birth after cesarean, Rh positive   Breastfeeding  Plan:  Routine postpartum care and education.   Continue orders as written. Reassess as needed.   Discharge Sunday AM due to inadequate GBS treatment during labor unless early discharge okay with Peds.    LOS: 0 days   Diona Fanti, CNM [facility], [facility] 02/17/2019 9:35 AM

## 2019-02-18 LAB — CBC
HCT: 29.6 % — ABNORMAL LOW (ref 36.0–46.0)
Hemoglobin: 9.7 g/dL — ABNORMAL LOW (ref 12.0–15.0)
MCH: 32 pg (ref 26.0–34.0)
MCHC: 32.8 g/dL (ref 30.0–36.0)
MCV: 97.7 fL (ref 80.0–100.0)
Platelets: 194 10*3/uL (ref 150–400)
RBC: 3.03 MIL/uL — ABNORMAL LOW (ref 3.87–5.11)
RDW: 12.9 % (ref 11.5–15.5)
WBC: 10.5 10*3/uL (ref 4.0–10.5)
nRBC: 0 % (ref 0.0–0.2)

## 2019-02-18 LAB — RPR: RPR Ser Ql: NONREACTIVE

## 2019-02-18 NOTE — Progress Notes (Signed)
Patient ID: Joanna Page, female   DOB: Oct 30, 1985, 33 y.o.   MRN: [id number]  Post Partum Day # 1, s/p spontaneous vaginal birth after cesarean  Subjective:  Patient doing well. Sitting in bed. FOB at bedside holding infant.   Denies difficulty breathing or respiratory distress, chest pain, abdominal pain, excessive vaginal bleeding, dysuria, and leg pain or swelling.   Objective:  Temp:  [97.8 F (36.6 C)-98.3 F (36.8 C)] 98 F (36.7 C) (06/20 0807) Pulse Rate:  [63-80] 63 (06/20 0807) Resp:  [18-20] 18 (06/20 0807) BP: (98-120)/(64-81) 98/75 (06/20 0807) SpO2:  [97 %-100 %] 99 % (06/20 0807)  Physical Exam:   General: alert and cooperative   Lungs: clear to auscultation bilaterally  Breasts: deferred, no complaints  Heart: normal apical impulse  Abdomen: soft, non-tender; bowel sounds normal; no masses,  no organomegaly  Pelvis: Lochia: appropriate, Uterine Fundus: firm  Extremities: DVT Evaluation: No evidence of DVT seen on physical exam.  Recent Labs    02/18/19 0632  HGB 9.7*  HCT 29.6*    Assessment:  33 year old G4P3, Post Partum Day # 1, s/p spontaneous vaginal birth after cesarean, Rh positive   Breastfeeding  Plan:  Continue orders as written. Reassess as needed.   Anticipated discharge later today or early AM.    LOS: 1 day   Diona Fanti, CNM [facility], [facility] 02/18/2019 10:37 AM

## 2019-02-18 NOTE — Lactation Note (Signed)
This note was copied from a baby's chart. Lactation Consultation Note  Patient Name: Boy Tequita Marrs [id number]'[id number] Date: 02/18/2019   Mom reports struggling yesterday with breast feeding and required lots of assistance.  Today mom is latching [name] on her own.  [name] has slight tongue tie which was pointed out to mom and Pediatrician was made aware.  Handout given from [facility] and [facility] if follow up was required.  Mom's nipples are slightly flat, but evert with compression.  Observed mom latching [name] independently with and without nipple shield.  He has strong rhythmic suck and occasional swallow.  Mom's nipples are slightly tender.  Coconut oil given with instructions in use.  Reviewed supply and demand, stomach size, feeding cues, routine newborn feeding patterns and normal course of lactation.  Lactation name and number written on white board and encouraged to call with any questions, concerns or assistance.    Maternal Data    Feeding    LATCH Score                   Interventions    Lactation Tools Discussed/Used     Consult Status      Jarold Motto 02/18/2019, 8:47 PM

## 2019-02-19 ENCOUNTER — Inpatient Hospital Stay: Admit: 2019-02-19 | Payer: Self-pay

## 2019-02-19 MED ORDER — IBUPROFEN 600 MG PO TABS: 600.0000 mg | ORAL_TABLET | Freq: Four times a day (QID) | ORAL | 0 refills | Status: DC

## 2019-02-19 MED ORDER — ACETAMINOPHEN 325 MG PO TABS: 650.0000 mg | ORAL_TABLET | ORAL | 0 refills | Status: DC | PRN

## 2019-02-19 MED ORDER — FERROUS SULFATE 325 (65 FE) MG PO TABS: 325.0000 mg | ORAL_TABLET | Freq: Every day | ORAL | 3 refills | Status: DC

## 2019-02-19 NOTE — Discharge Instructions (Signed)
Call your doctor for increased pain or vaginal bleeding, temperature above 100.4, depression, or concerns.  Increase calories and fluids while breastfeeding.  Continue prenatal vitamin and iron.  No strenuous activity or heavy lifting for 6 weeks. No intercourse, tampons, or douching for 6 weeks.  No tub baths- showers only.  No driving for 2 weeks or while taking pain medications.   °

## 2019-02-19 NOTE — Progress Notes (Signed)
Discharge instructions provided.  Pt and sig other verbalize understanding of all instructions and follow-up care.  Pt discharged to home with infant at 1207 on 02/19/19 via wheelchair by RN. Reed Breech, RN 02/19/2019 12:35 PM

## 2019-02-19 NOTE — Discharge Summary (Signed)
   Obstetric Discharge Summary  Patient ID: Joanna Page MRN: [id number] DOB/AGE: June 21, 1986 33 y.o.   Date of Admission: 02/17/2019  Date of Discharge:  02/19/19  Admitting Diagnosis: Onset of Labor at [redacted]w[redacted]d  Secondary Diagnosis: History of cesarean, History of VBAC, Postpartum anemia  Mode of Delivery: Normal spontaneous vaginal delivery     Discharge Diagnosis: No other diagnosis   Intrapartum Procedures: None   Post partum procedures: None  Complications: Periurethral laceration   Brief Hospital Course   Joanna Page is a [id number] who had a SVD on 02/17/2019;  for further details of this birth, please refer to the delivey summary.  Patient had an uncomplicated postpartum course.  By time of discharge on PPD#2, her pain was controlled on oral pain medications; she had appropriate lochia and was ambulating, voiding without difficulty and tolerating regular diet.  She was deemed stable for discharge to home.    Labs: CBC Latest Ref Rng & Units 02/18/2019 11/24/2018 08/29/2018  WBC 4.0 - 10.5 K/uL 10.5 8.1 7.3  Hemoglobin 12.0 - 15.0 g/dL 9.7(L) 11.0(L) 12.1  Hematocrit 36.0 - 46.0 % 29.6(L) 31.9(L) 35.0  Platelets 150 - 400 K/uL 194 240 240   A POS  Physical exam:   Temp:  [98.2 F (36.8 C)-98.5 F (36.9 C)] 98.3 F (36.8 C) (06/21 0807) Pulse Rate:  [62-86] 62 (06/21 0807) Resp:  [18-20] 20 (06/21 0807) BP: (100-109)/(62-76) 106/76 (06/21 0807) SpO2:  [98 %-100 %] 100 % (06/21 0807)  General: alert and no distress  Lochia: appropriate  Abdomen: soft, NT  Uterine Fundus: firm  Lacerations: healing well, no significant drainage, no dehiscence, no significant erythema  Extremities: No evidence of DVT seen on physical exam. No lower extremity edema.  Discharge Instructions: Per After Visit Summary.  Activity: Advance as tolerated. Pelvic rest for 6 weeks.  Also refer to After Visit Summary  Diet: Regular  Medications: Allergies as of 02/19/2019   No  Known Allergies     Medication List    TAKE these medications   acetaminophen 325 MG tablet Commonly known as: Tylenol Take 2 tablets (650 mg total) by mouth every 4 (four) hours as needed (for pain scale < 4).   ferrous sulfate 325 (65 FE) MG tablet Take 1 tablet (325 mg total) by mouth daily with breakfast.   ibuprofen 600 MG tablet Commonly known as: ADVIL Take 1 tablet (600 mg total) by mouth every 6 (six) hours.   prenatal multivitamin Tabs tablet Take 1 tablet by mouth daily.      Outpatient follow up:  Follow-up Information    Philip Aspen, CNM. Call in 6 week(s).   Specialties: Certified Nurse Midwife, Radiology Why: Please call to schedule six (6) week for postpartum visit Contact information: [address] [place] [phone]          Postpartum contraception: vasectomy  Discharged Condition: stable  Discharged to: home   Newborn Data:  Disposition:home with mother  Apgars: APGAR (1 MIN): 8   APGAR (5 MINS): 9    Baby Feeding: Breast    Diona Fanti, CNM [facility], [facility] 02/19/19 8:08 AM

## 2019-02-22 ENCOUNTER — Encounter: Payer: BC Managed Care – PPO | Admitting: Certified Nurse Midwife

## 2019-02-22 ENCOUNTER — Other Ambulatory Visit: Payer: BC Managed Care – PPO

## 2019-03-31 ENCOUNTER — Other Ambulatory Visit: Payer: Self-pay

## 2019-03-31 ENCOUNTER — Ambulatory Visit (INDEPENDENT_AMBULATORY_CARE_PROVIDER_SITE_OTHER): Payer: BC Managed Care – PPO | Admitting: Certified Nurse Midwife

## 2019-03-31 ENCOUNTER — Encounter: Payer: Self-pay | Admitting: Certified Nurse Midwife

## 2019-03-31 NOTE — Progress Notes (Signed)
Subjective:    Joanna Page is a 33 y.o. [id number] Caucasian female who presents for a postpartum visit. She is 6 weeks postpartum following a spontaneous vaginal delivery at 39.5 gestational weeks. Anesthesia: local. I have fully reviewed the prenatal and intrapartum course. Postpartum course has been WNL. Baby's course has been WNL. Baby is feeding by breast. Bleeding no bleeding. Bowel function is normal. Bladder function is normal. Patient is sexually active. Contraception method is condoms, pt husand getting vasectomy. Postpartum depression screening: negative. Score 1.  Last pap 2016 and was WNL.  The following portions of the patient's history were reviewed and updated as appropriate: allergies, current medications, past medical history, past surgical history and problem list.  Review of Systems Pertinent items are noted in HPI.   Vitals:   03/31/19 1024  BP: 112/78  Pulse: 77  Weight: 185 lb 12.8 oz (84.3 kg)  Height: 5\' 10"  (1.778 m)   No LMP recorded.  Objective:   General:  alert, cooperative and no distress   Breasts:  deferred, no complaints  Lungs: clear to auscultation bilaterally  Heart:  regular rate and rhythm  Abdomen: soft, nontender   Vulva: normal  Vagina: normal vagina  Cervix:  closed  Corpus: Well-involuted  Adnexa:  Non-palpable  Rectal Exam:  no hemorrhoids        Assessment:   Postpartum exam 6 wks s/p AVD Breastfeeding Depression screening Contraception counseling   Plan:  : condoms, husband having vasectomy. She complains of hormonal hives, states she used to get them with her period. She used evening prim rose oil which used to help but has become less effective. Discussed use of antihistamine and trial of BC hormones to help control occurrence.  Follow up in: 6 months for annual or earlier if needed  Philip Aspen, CNM

## 2019-03-31 NOTE — Patient Instructions (Signed)
Preventive Care 21-35 Years Old, Female Preventive care refers to visits with your health care provider and lifestyle choices that can promote health and wellness. This includes:  A yearly physical exam. This may also be called an annual well check.  Regular dental visits and eye exams.  Immunizations.  Screening for certain conditions.  Healthy lifestyle choices, such as eating a healthy diet, getting regular exercise, not using drugs or products that contain nicotine and tobacco, and limiting alcohol use. What can I expect for my preventive care visit? Physical exam Your health care provider will check your:  Height and weight. This may be used to calculate body mass index (BMI), which tells if you are at a healthy weight.  Heart rate and blood pressure.  Skin for abnormal spots. Counseling Your health care provider may ask you questions about your:  Alcohol, tobacco, and drug use.  Emotional well-being.  Home and relationship well-being.  Sexual activity.  Eating habits.  Work and work environment.  Method of birth control.  Menstrual cycle.  Pregnancy history. What immunizations do I need?  Influenza (flu) vaccine  This is recommended every year. Tetanus, diphtheria, and pertussis (Tdap) vaccine  You may need a Td booster every 10 years. Varicella (chickenpox) vaccine  You may need this if you have not been vaccinated. Human papillomavirus (HPV) vaccine  If recommended by your health care provider, you may need three doses over 6 months. Measles, mumps, and rubella (MMR) vaccine  You may need at least one dose of MMR. You may also need a second dose. Meningococcal conjugate (MenACWY) vaccine  One dose is recommended if you are age 19-21 years and a first-year college student living in a residence hall, or if you have one of several medical conditions. You may also need additional booster doses. Pneumococcal conjugate (PCV13) vaccine  You may need  this if you have certain conditions and were not previously vaccinated. Pneumococcal polysaccharide (PPSV23) vaccine  You may need one or two doses if you smoke cigarettes or if you have certain conditions. Hepatitis A vaccine  You may need this if you have certain conditions or if you travel or work in places where you may be exposed to hepatitis A. Hepatitis B vaccine  You may need this if you have certain conditions or if you travel or work in places where you may be exposed to hepatitis B. Haemophilus influenzae type b (Hib) vaccine  You may need this if you have certain conditions. You may receive vaccines as individual doses or as more than one vaccine together in one shot (combination vaccines). Talk with your health care provider about the risks and benefits of combination vaccines. What tests do I need?  Blood tests  Lipid and cholesterol levels. These may be checked every 5 years starting at age 20.  Hepatitis C test.  Hepatitis B test. Screening  Diabetes screening. This is done by checking your blood sugar (glucose) after you have not eaten for a while (fasting).  Sexually transmitted disease (STD) testing.  BRCA-related cancer screening. This may be done if you have a family history of breast, ovarian, tubal, or peritoneal cancers.  Pelvic exam and Pap test. This may be done every 3 years starting at age 21. Starting at age 30, this may be done every 5 years if you have a Pap test in combination with an HPV test. Talk with your health care provider about your test results, treatment options, and if necessary, the need for more tests.   Follow these instructions at home: Eating and drinking   Eat a diet that includes fresh fruits and vegetables, whole grains, lean protein, and low-fat dairy.  Take vitamin and mineral supplements as recommended by your health care provider.  Do not drink alcohol if: ? Your health care provider tells you not to drink. ? You are  pregnant, may be pregnant, or are planning to become pregnant.  If you drink alcohol: ? Limit how much you have to 0-1 drink a day. ? Be aware of how much alcohol is in your drink. In the [place] one drink equals one 12 oz bottle of beer (355 mL), one 5 oz glass of wine (148 mL), or one 1 oz glass of hard liquor (44 mL). Lifestyle  Take daily care of your teeth and gums.  Stay active. Exercise for at least 30 minutes on 5 or more days each week.  Do not use any products that contain nicotine or tobacco, such as cigarettes, e-cigarettes, and chewing tobacco. If you need help quitting, ask your health care provider.  If you are sexually active, practice safe sex. Use a condom or other form of birth control (contraception) in order to prevent pregnancy and STIs (sexually transmitted infections). If you plan to become pregnant, see your health care provider for a preconception visit. What's next?  Visit your health care provider once a year for a well check visit.  Ask your health care provider how often you should have your eyes and teeth checked.  Stay up to date on all vaccines. This information is not intended to replace advice given to you by your health care provider. Make sure you discuss any questions you have with your health care provider. Document Released: 10/13/2001 Document Revised: 04/28/2018 Document Reviewed: 04/28/2018 Elsevier Patient Education  2020 [employer]  

## 2019-03-31 NOTE — Progress Notes (Signed)
Patient here for post-partum visit, no complaints.  

## 2019-10-02 ENCOUNTER — Encounter: Payer: Self-pay | Admitting: Certified Nurse Midwife

## 2019-11-20 ENCOUNTER — Ambulatory Visit: Payer: Self-pay | Admitting: Certified Nurse Midwife

## 2019-11-20 ENCOUNTER — Encounter: Payer: Self-pay | Admitting: Certified Nurse Midwife

## 2019-11-20 ENCOUNTER — Other Ambulatory Visit (HOSPITAL_COMMUNITY)
Admission: RE | Admit: 2019-11-20 | Discharge: 2019-11-20 | Disposition: A | Discharge status: Home / Self Care | Payer: Self-pay | Source: Ambulatory Visit | Attending: Certified Nurse Midwife | Admitting: Certified Nurse Midwife

## 2019-11-20 ENCOUNTER — Other Ambulatory Visit: Payer: Self-pay

## 2019-11-20 VITALS — BP 115/74 | HR 66 | Ht 70.0 in | Wt 184.2 lb

## 2019-11-20 DIAGNOSIS — Z01419 Encounter for gynecological examination (general) (routine) without abnormal findings: Secondary | ICD-10-CM

## 2019-11-20 DIAGNOSIS — Z13220 Encounter for screening for lipoid disorders: Secondary | ICD-10-CM

## 2019-11-20 DIAGNOSIS — Z124 Encounter for screening for malignant neoplasm of cervix: Secondary | ICD-10-CM

## 2019-11-20 DIAGNOSIS — Z136 Encounter for screening for cardiovascular disorders: Secondary | ICD-10-CM

## 2019-11-20 NOTE — Patient Instructions (Signed)
Preventive Care 21-36 Years Old, Female Preventive care refers to visits with your health care provider and lifestyle choices that can promote health and wellness. This includes:  A yearly physical exam. This may also be called an annual well check.  Regular dental visits and eye exams.  Immunizations.  Screening for certain conditions.  Healthy lifestyle choices, such as eating a healthy diet, getting regular exercise, not using drugs or products that contain nicotine and tobacco, and limiting alcohol use. What can I expect for my preventive care visit? Physical exam Your health care provider will check your:  Height and weight. This may be used to calculate body mass index (BMI), which tells if you are at a healthy weight.  Heart rate and blood pressure.  Skin for abnormal spots. Counseling Your health care provider may ask you questions about your:  Alcohol, tobacco, and drug use.  Emotional well-being.  Home and relationship well-being.  Sexual activity.  Eating habits.  Work and work environment.  Method of birth control.  Menstrual cycle.  Pregnancy history. What immunizations do I need?  Influenza (flu) vaccine  This is recommended every year. Tetanus, diphtheria, and pertussis (Tdap) vaccine  You may need a Td booster every 10 years. Varicella (chickenpox) vaccine  You may need this if you have not been vaccinated. Human papillomavirus (HPV) vaccine  If recommended by your health care provider, you may need three doses over 6 months. Measles, mumps, and rubella (MMR) vaccine  You may need at least one dose of MMR. You may also need a second dose. Meningococcal conjugate (MenACWY) vaccine  One dose is recommended if you are age 19-21 years and a first-year college student living in a residence hall, or if you have one of several medical conditions. You may also need additional booster doses. Pneumococcal conjugate (PCV13) vaccine  You may need  this if you have certain conditions and were not previously vaccinated. Pneumococcal polysaccharide (PPSV23) vaccine  You may need one or two doses if you smoke cigarettes or if you have certain conditions. Hepatitis A vaccine  You may need this if you have certain conditions or if you travel or work in places where you may be exposed to hepatitis A. Hepatitis B vaccine  You may need this if you have certain conditions or if you travel or work in places where you may be exposed to hepatitis B. Haemophilus influenzae type b (Hib) vaccine  You may need this if you have certain conditions. You may receive vaccines as individual doses or as more than one vaccine together in one shot (combination vaccines). Talk with your health care provider about the risks and benefits of combination vaccines. What tests do I need?  Blood tests  Lipid and cholesterol levels. These may be checked every 5 years starting at age 20.  Hepatitis C test.  Hepatitis B test. Screening  Diabetes screening. This is done by checking your blood sugar (glucose) after you have not eaten for a while (fasting).  Sexually transmitted disease (STD) testing.  BRCA-related cancer screening. This may be done if you have a family history of breast, ovarian, tubal, or peritoneal cancers.  Pelvic exam and Pap test. This may be done every 3 years starting at age 21. Starting at age 30, this may be done every 5 years if you have a Pap test in combination with an HPV test. Talk with your health care provider about your test results, treatment options, and if necessary, the need for more tests.   Follow these instructions at home: Eating and drinking   Eat a diet that includes fresh fruits and vegetables, whole grains, lean protein, and low-fat dairy.  Take vitamin and mineral supplements as recommended by your health care provider.  Do not drink alcohol if: ? Your health care provider tells you not to drink. ? You are  pregnant, may be pregnant, or are planning to become pregnant.  If you drink alcohol: ? Limit how much you have to 0-1 drink a day. ? Be aware of how much alcohol is in your drink. In the [place] one drink equals one 12 oz bottle of beer (355 mL), one 5 oz glass of wine (148 mL), or one 1 oz glass of hard liquor (44 mL). Lifestyle  Take daily care of your teeth and gums.  Stay active. Exercise for at least 30 minutes on 5 or more days each week.  Do not use any products that contain nicotine or tobacco, such as cigarettes, e-cigarettes, and chewing tobacco. If you need help quitting, ask your health care provider.  If you are sexually active, practice safe sex. Use a condom or other form of birth control (contraception) in order to prevent pregnancy and STIs (sexually transmitted infections). If you plan to become pregnant, see your health care provider for a preconception visit. What's next?  Visit your health care provider once a year for a well check visit.  Ask your health care provider how often you should have your eyes and teeth checked.  Stay up to date on all vaccines. This information is not intended to replace advice given to you by your health care provider. Make sure you discuss any questions you have with your health care provider. Document Revised: 04/28/2018 Document Reviewed: 04/28/2018 Elsevier Patient Education  2020 [employer].

## 2019-11-20 NOTE — Progress Notes (Signed)
GYNECOLOGY ANNUAL PREVENTATIVE CARE ENCOUNTER NOTE  History:     Joanna Page is a 34 y.o. [phone] female here for a routine annual gynecologic exam.  Current complaints: none   Denies abnormal vaginal bleeding, discharge, pelvic pain, problems with intercourse or other gynecologic concerns.     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 Contraception: condoms Last Pap: 2016. Results were: normal per pt.  Last mammogram: n/a  Obstetric History OB History  Gravida Para Term Preterm AB Living  4 2 2   1 3   SAB TAB Ectopic Multiple Live Births      1 0 3    # Outcome Date GA Lbr Len/2nd Weight Sex Delivery Anes PTL Lv  4 Term 02/17/19 [redacted]w[redacted]d  7 lb 14.6 oz (3.59 kg) M VBAC None  LIV  3 Term 02/06/15 [redacted]w[redacted]d 02:24 / 00:13 7 lb 3 oz (3.26 kg) M VBAC, Vacuum None  LIV  2 Gravida 06/04/13    M CS-LTranv   LIV     Complications: Fetal Intolerance  1 Ectopic             No past medical history on file.  Past Surgical History:  Procedure Laterality Date  . CESAREAN SECTION    . WISDOM TOOTH EXTRACTION      Current Outpatient Medications on File Prior to Visit  Medication Sig Dispense Refill  . Prenatal Vit-Fe Fumarate-FA (PRENATAL MULTIVITAMIN) TABS tablet Take 1 tablet by mouth daily.     08/04/13 acetaminophen (TYLENOL) 325 MG tablet Take 2 tablets (650 mg total) by mouth every 4 (four) hours as needed (for pain scale < 4). 60 tablet 0  . ibuprofen (ADVIL) 600 MG tablet Take 1 tablet (600 mg total) by mouth every 6 (six) hours. 30 tablet 0   No current facility-administered medications on file prior to visit.    No Known Allergies  Social History:  reports that she has never smoked. She has never used smokeless tobacco. She reports current alcohol use. She reports that she does not use  drugs.  Family History  Problem Relation Age of Onset  . Other Neg Hx   . Breast cancer Neg Hx   . Ovarian cancer Neg Hx   . Colon cancer Neg Hx     The following portions of the patient's history were reviewed and updated as appropriate: allergies, current medications, past family history, past medical history, past social history, past surgical history and problem list.  Review of Systems Pertinent items noted in HPI and remainder of comprehensive ROS otherwise negative.  Physical Exam:  BP 115/74   Pulse 66   Ht 5\' 10"  (1.778 m)   Wt 184 lb 3 oz (83.5 kg)   LMP 10/29/2019 (Exact Date)   Breastfeeding Yes   BMI 26.43 kg/m  CONSTITUTIONAL: Well-developed, well-nourished female in no acute distress.  HENT:  Normocephalic, atraumatic, External right and left ear normal. Oropharynx is clear and moist EYES: Conjunctivae and EOM are normal. Pupils are equal, round, and reactive to light. No scleral icterus.  NECK: Normal range of motion, supple, no masses.  Normal thyroid.  SKIN: Skin is warm and dry. No rash noted. Not diaphoretic. No erythema. No pallor. MUSCULOSKELETAL: Normal range of motion. No tenderness.  No cyanosis, clubbing, or edema.  2+ distal pulses. NEUROLOGIC: Alert and oriented to person,  place, and time. Normal reflexes, muscle tone coordination.  PSYCHIATRIC: Normal mood and affect. Normal behavior. Normal judgment and thought content. CARDIOVASCULAR: Normal heart rate noted, regular rhythm RESPIRATORY: Clear to auscultation bilaterally. Effort and breath sounds normal, no problems with respiration noted. BREASTS: Symmetric in size. No masses, tenderness, skin changes, nipple drainage, or lymphadenopathy bilaterally. Performed in the presence of a chaperone. ABDOMEN: Soft, no distention noted.  No tenderness, rebound or guarding.  PELVIC: Normal appearing external genitalia and urethral meatus; normal appearing vaginal mucosa and cervix.  No abnormal discharge noted.   Pap smear obtained.  Normal uterine size, no other palpable masses, no uterine or adnexal tenderness.  Performed in the presence of a chaperone.   Assessment and Plan:    1. Women's annual routine gynecological examination  Will follow up results of pap smear and manage accordingly. Mammogram not indicated Labs: lipid profile Refills: none Routine preventative health maintenance measures emphasized. Please refer to After Visit Summary for other counseling recommendations.      Philip Aspen, CNM

## 2019-11-21 LAB — LIPID PANEL
Chol/HDL Ratio: 3.9 ratio (ref 0.0–4.4)
Cholesterol, Total: 201 mg/dL — ABNORMAL HIGH (ref 100–199)
HDL: 52 mg/dL (ref >39)
LDL Chol Calc (NIH): 130 mg/dL — ABNORMAL HIGH (ref 0–99)
Triglycerides: 107 mg/dL (ref 0–149)
VLDL Cholesterol Cal: 19 mg/dL (ref 5–40)

## 2019-11-23 LAB — CYTOLOGY - PAP
Comment: NEGATIVE
Diagnosis: NEGATIVE
High risk HPV: NEGATIVE

## 2020-05-05 ENCOUNTER — Encounter (HOSPITAL_BASED_OUTPATIENT_CLINIC_OR_DEPARTMENT_OTHER): Payer: Self-pay | Admitting: Emergency Medicine

## 2020-05-05 DIAGNOSIS — Y999 Unspecified external cause status: Secondary | ICD-10-CM | POA: Insufficient documentation

## 2020-05-05 DIAGNOSIS — Z5321 Procedure and treatment not carried out due to patient leaving prior to being seen by health care provider: Secondary | ICD-10-CM | POA: Insufficient documentation

## 2020-05-05 DIAGNOSIS — Y939 Activity, unspecified: Secondary | ICD-10-CM | POA: Insufficient documentation

## 2020-05-05 DIAGNOSIS — S81011A Laceration without foreign body, right knee, initial encounter: Secondary | ICD-10-CM | POA: Insufficient documentation

## 2020-05-05 DIAGNOSIS — X58XXXA Exposure to other specified factors, initial encounter: Secondary | ICD-10-CM | POA: Insufficient documentation

## 2020-05-05 DIAGNOSIS — Y929 Unspecified place or not applicable: Secondary | ICD-10-CM | POA: Insufficient documentation
